# Patient Record
Sex: Female | Born: 1940 | Race: White | Hispanic: No | Marital: Married | Smoking: Never smoker
Health system: Southern US, Academic
[De-identification: ages and names within clinical notes are randomized; demographics above are authoritative.]

## PROBLEM LIST (undated history)

## (undated) DIAGNOSIS — C801 Malignant (primary) neoplasm, unspecified: Secondary | ICD-10-CM

## (undated) DIAGNOSIS — I10 Essential (primary) hypertension: Secondary | ICD-10-CM

## (undated) DIAGNOSIS — C9000 Multiple myeloma not having achieved remission: Secondary | ICD-10-CM

## (undated) HISTORY — PX: HX BACK SURGERY: SHX140

## (undated) NOTE — Progress Notes (Signed)
 Formatting of this note is different from the original.  Subjective   Patient ID: Katelyn Craig is a 40 y.o. female presenting to the Urgent Care with a chief complaint of toe redness (Bilateral feet, all toes very red and sore, toenails are starting to split after getting them done at [facility]. ).    Right 2nd toe is infected, red, swollen, painful. Had toes done at the [facility], a school for cosmetology. Her daughter states she thinks she contracted a nail fungus from it, but is also concerned due to the toe being split open and it looking infected.     History provided by:  Patient and relative  History limited by:  Age  Language interpreter used: No      Objective   Vitals:    07/25/24 1326   BP: 119/69   Pulse: 72   Resp: 20   Temp: 36.4 C (97.6 F)   TempSrc: Temporal   SpO2: 98%   Weight: 66.2 kg (146 lb)   Height: 1.778 m (5' 10)     No results found.   Vital signs reviewed.    Physical Exam  Vitals reviewed.   Constitutional:       Appearance: Normal appearance.   Cardiovascular:      Rate and Rhythm: Normal rate.   Pulmonary:      Effort: Pulmonary effort is normal.   Skin:     General: Skin is warm.      Capillary Refill: Capillary refill takes less than 2 seconds.      Findings: Erythema present.      Comments: Right 2nd toe is split down the middle, traveling about halfway down the toenail. The rest of the toes have thick, yellow, crusty appearance to the toenails. Daughter is concerned about fungus. Discussed that her PCP would take a piece of the toenail and send it in as a specimen and that she would have to do routine liver enzymes due to the medication being hard on the liver. I also discussed with her that the PCP may decide not to do the medication because it is so hard on the liver. She will follow up with PCP for ongoing care.   Neurological:      General: No focal deficit present.      Mental Status: She is alert and oriented to person, place, and time.   Psychiatric:          Mood and Affect: Mood normal.         Behavior: Behavior normal.         Assessment & Plan    Assessment & Plan  Infection of toenail            In-House Lab Results:   No results found for this or any previous visit.     In-House Imaging Reads:        Procedure Documentation:  Procedures     ED Course & MDM   MDM - Medical Decision Making: Independent historian used.   Electronically signed by Dufm Stephane Lunger, CRNP at 07/25/2024  1:49 PM EST

---

## 1997-11-16 ENCOUNTER — Other Ambulatory Visit (HOSPITAL_COMMUNITY): Payer: Self-pay

## 2014-12-14 NOTE — Unmapped External Note (Signed)
 Procedure(s):  LUMBAR STABILIZATION T12-L4 percutaneous fusion with [employer], O- Arm Guidance   Procedure Note    Katelyn Craig   [id number]  12/14/2014      Pre-op Diagnosis: L2 instability        Post-op Diagnosis: SAME    CPT Code: * Diagnosis form incomplete *.    ICD-10 : * No Diagnosis Codes entered *    Surgeon(s) and Role:     * Ozell Glendia Bolls, PA-C - Assisting     * Darryle Earthly, MD - Primary    Laterality : N/A    Anesthesia: General    Staff:   Circulator: Juliana Dirzo Blevins, RN  Scrub Person: Aloysius Shelly Chari Edrick    Estimated Blood Loss: 200 mL    Total IV Fluids :  ml    Urinary Output:  ml                 Specimens: * No specimens in log *    Findings:             Drains:   Urethral Catheter 12/14/14 0725 Non-latex;Temperature probe 16 Fr. (Active)       Condition and Disposition : Stable    Electronically signed by: Ozell Glendia Bolls, PA-C  Date: 12/14/2014  Time: 10:54 AM                        Electronically signed by: Ozell Glendia Bolls, PA-C  12/14/14 1054

## 2017-04-16 IMAGING — US ULTRASOUND BREAST
1 series · 13 of 25 positions shown · non-contrast
Comparison: None. This is a baseline exam.

PETR, POOYA

Exam:  
3D bilateral diagnostic mammogram Tomosynthesis with CAD
Exam:
Complete right breast ultrasound
Complete left breast ultrasound
INDICATION: Palpable lump left breast. Patient could not pinpoint the location of the lump at the time of examination.

[Series 1: ultrasound breast · 0.10mm/px · 13 of 41 slices shown]
[im 1/41]
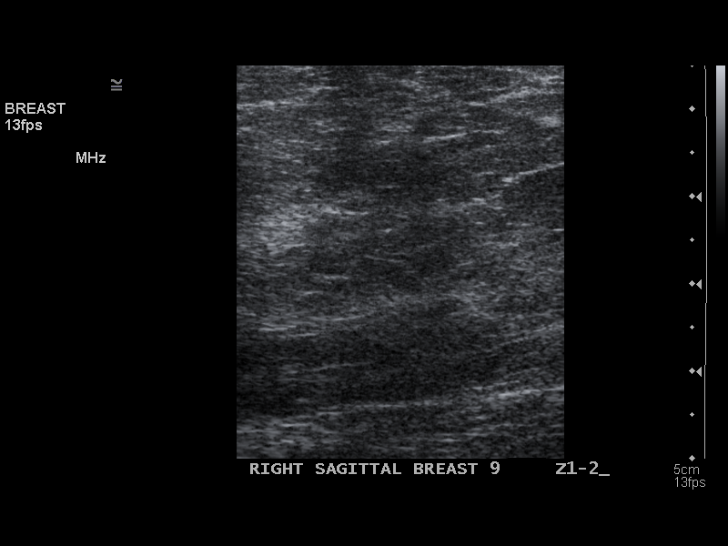
[im 4/41]
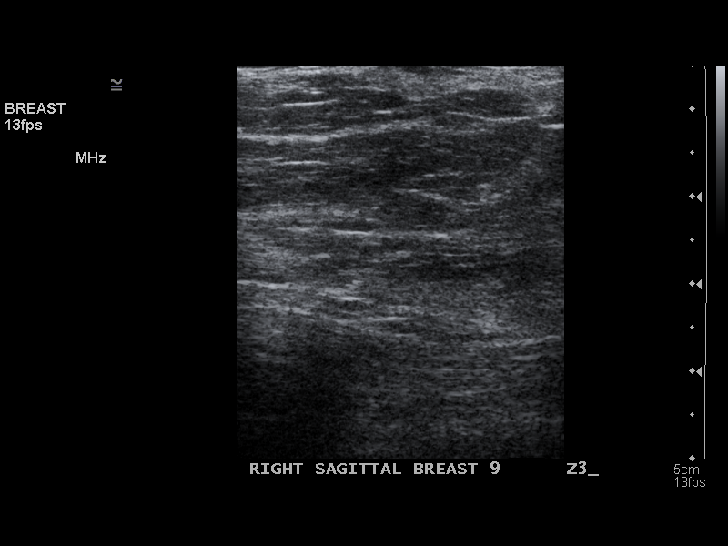
[im 7/41]
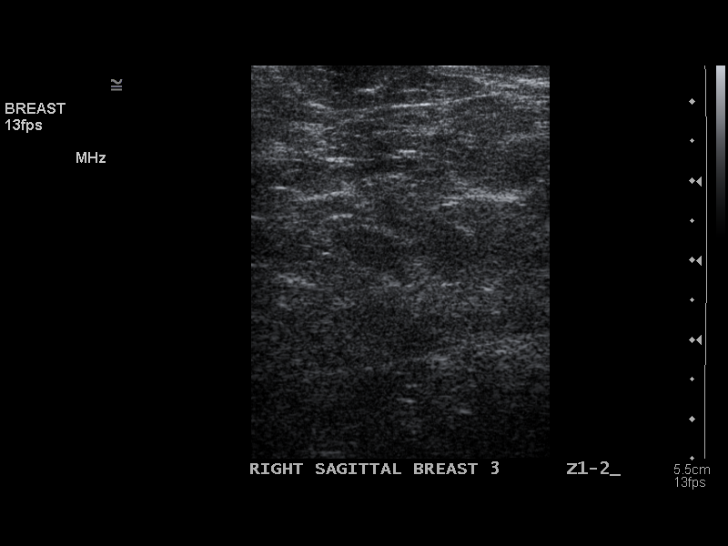
[im 11/41]
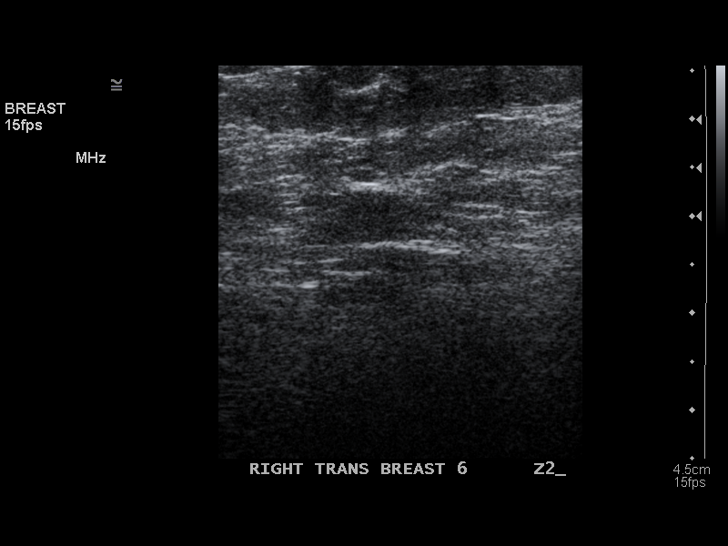
[im 14/41]
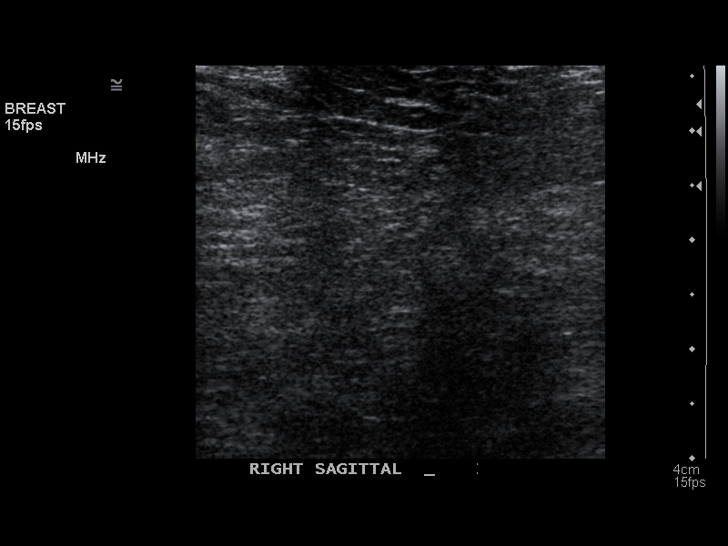
[im 17/41]
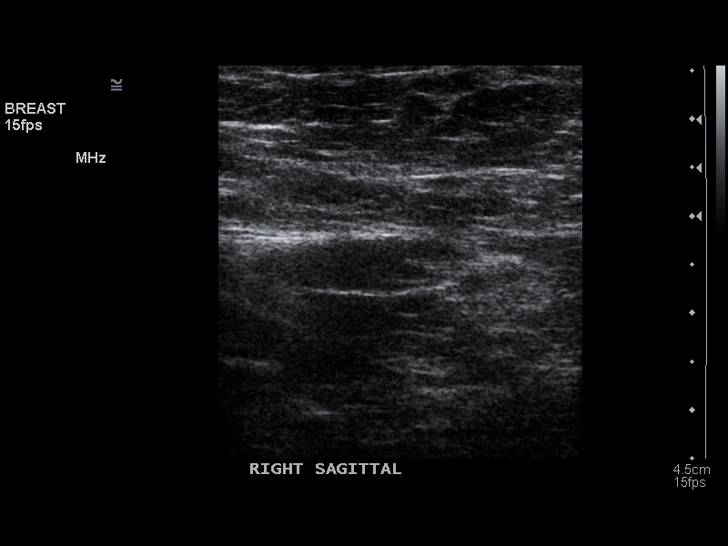
[im 21/41]
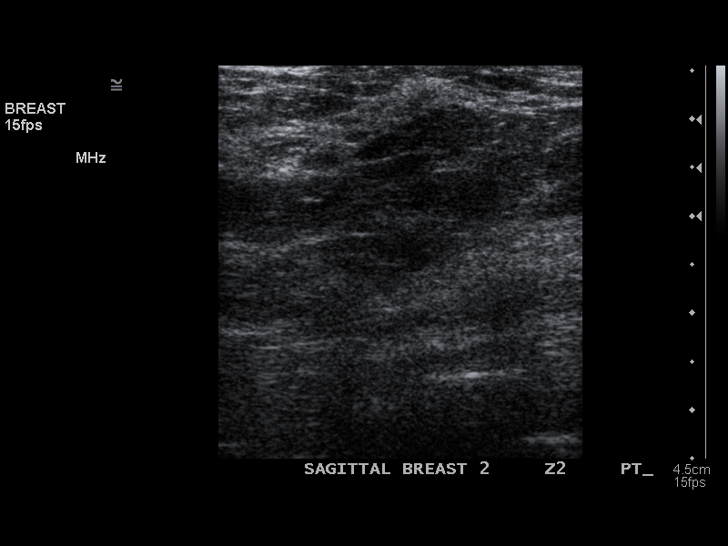
[im 24/41]
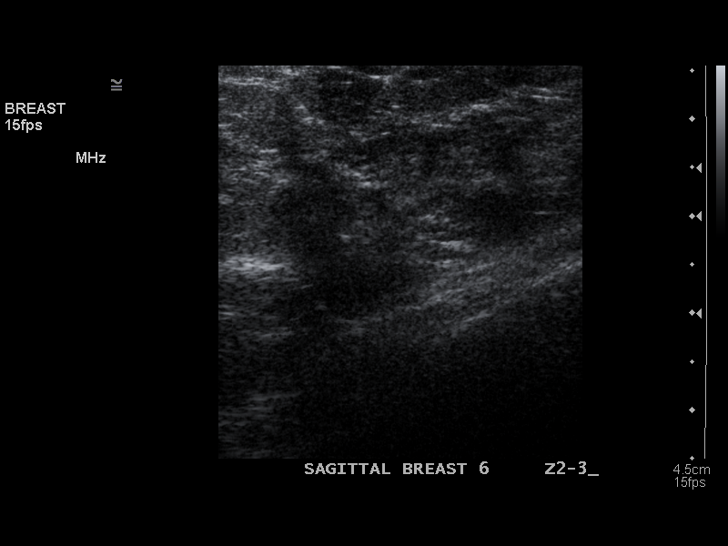
[im 27/41]
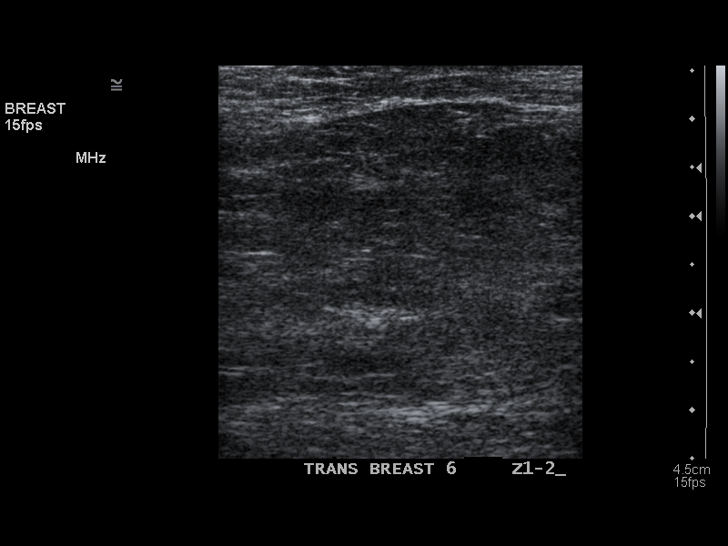
[im 31/41]
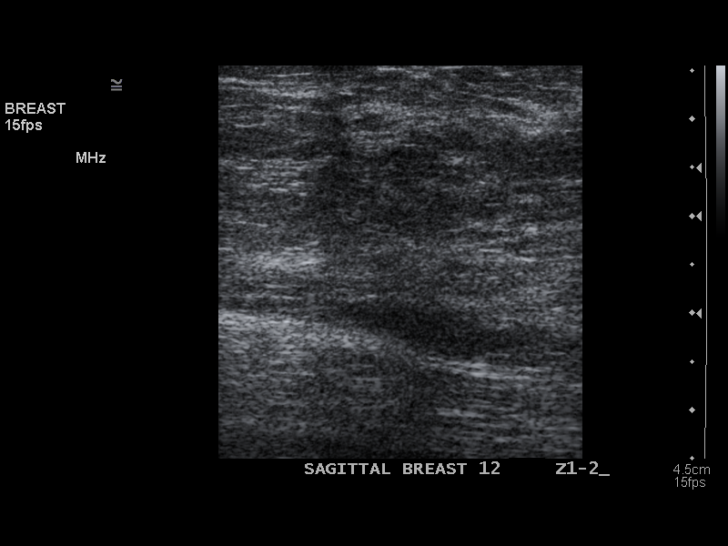
[im 34/41]
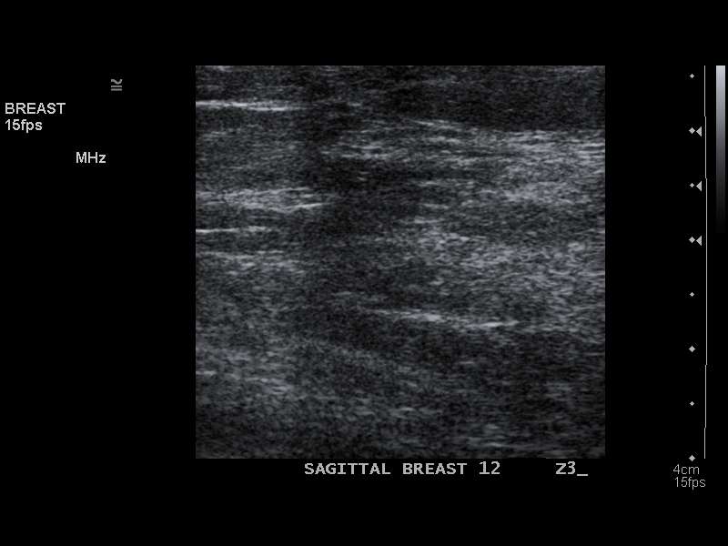
[im 37/41]
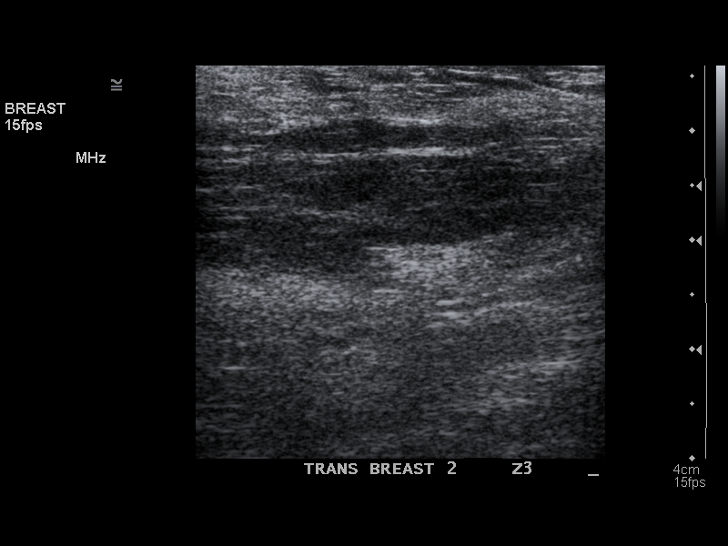
[im 41/41]
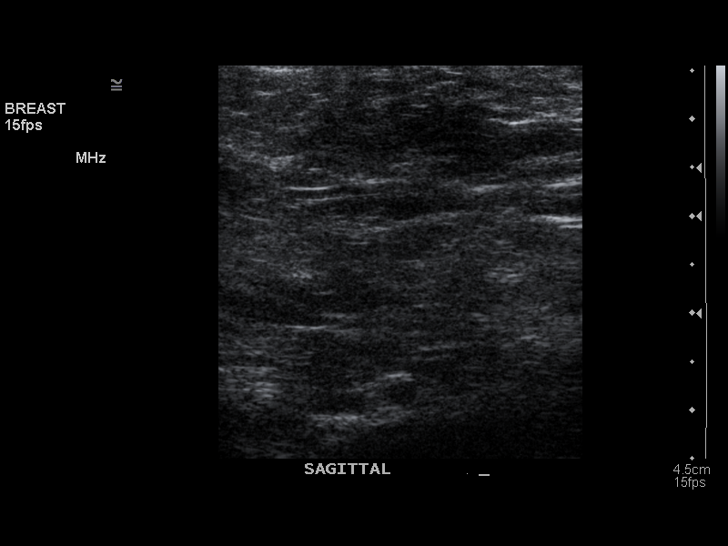

[13 of 25 positions shown; findings below may reference images not displayed]

FINDINGS: There are scattered fibroglandular elements. There are a few subcentimeter benign appearing well circumscribed nodules within the left breast. There is suspicious cluster of microcalcifications. There is no architectural distortion, skin thickening or nipple retraction. 

A comprehensive sonographic evaluation of both breasts was performed and represented images of all four quadrants, retroareolar and axillary regions were obtained. There is no suspicious or cystic mass. No ductal dilatation or axillary adenopathy is identified on either side.
IMPRESSION: BIRADS 3-Probable benign findings. Six month followup left diagnostic mammogram is recommended for reassessment of the subcentimeter nodules given the lack of previous comparison mammograms. 

DENSITY CODE –  B (Scattered areas of fibroglandular density). 

Final Assessment Code:

Bi-Rads 3

BI-RADS 0
Need additional imaging evaluation

BI-RADS 1
Negative mammogram

BI-RADS 2
Benign finding

BI-RADS 3
Probably benign finding: short-interval follow-up suggested

BI-RADS 4
Suspicious abnormality:  biopsy should be considered

BI-RADS 5
Highly suggestive of malignancy; appropriate action should be taken

BI-RADS 6
Known Biopsy-proven Malignancy – Appropriate action should be taken

NOTE:
In compliance with Federal regulations, the results of this mammogram are being sent to the patient.

## 2017-04-16 IMAGING — MG 3D DX MAMMO BIL W/CAD
5 series · 7 of 24 positions shown · non-contrast
Comparison: None. This is a baseline exam.

------------- REPORT [id number] -------------
MANE, TURY

Exam:  
3D bilateral diagnostic mammogram Tomosynthesis with CAD
Exam:
Complete right breast ultrasound
Complete left breast ultrasound
INDICATION: Palpable lump left breast. Patient could not pinpoint the location of the lump at the time of examination.

[R CC · right · 0.10mm/px · 2 of 2 slices shown]
[im 1/2]
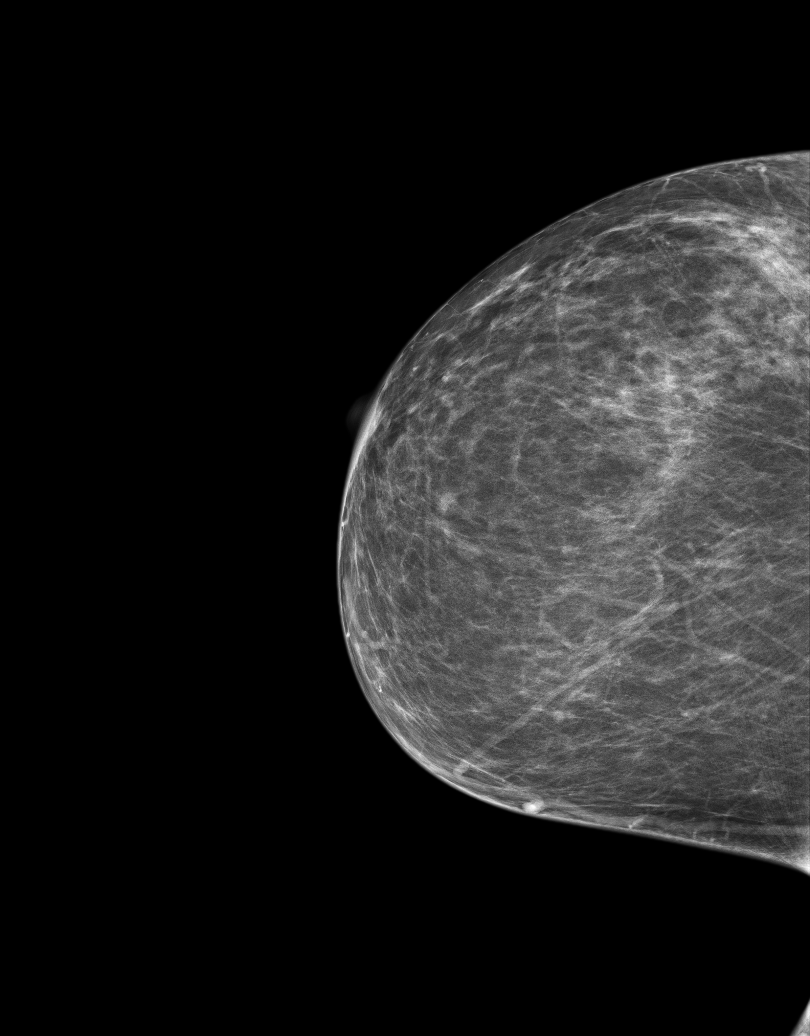
[im 2/2]
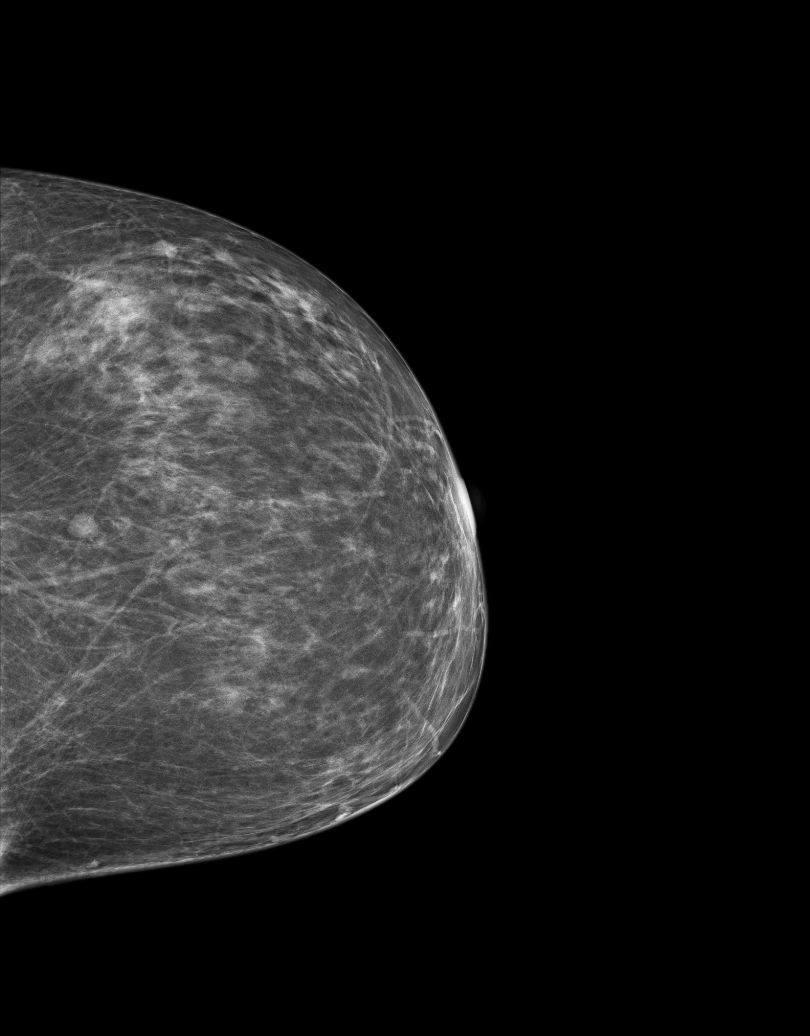

[3D DX MAMMO BIL W/CAD · 2 acquisitions, 2 frames shown (1 of 2)]
[im 1/2]
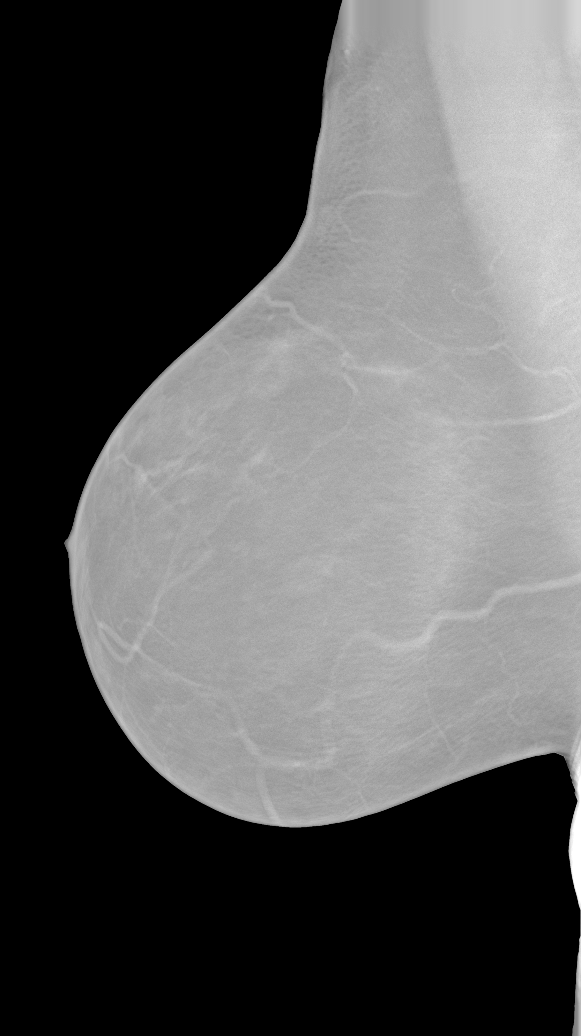
[im 2/2]
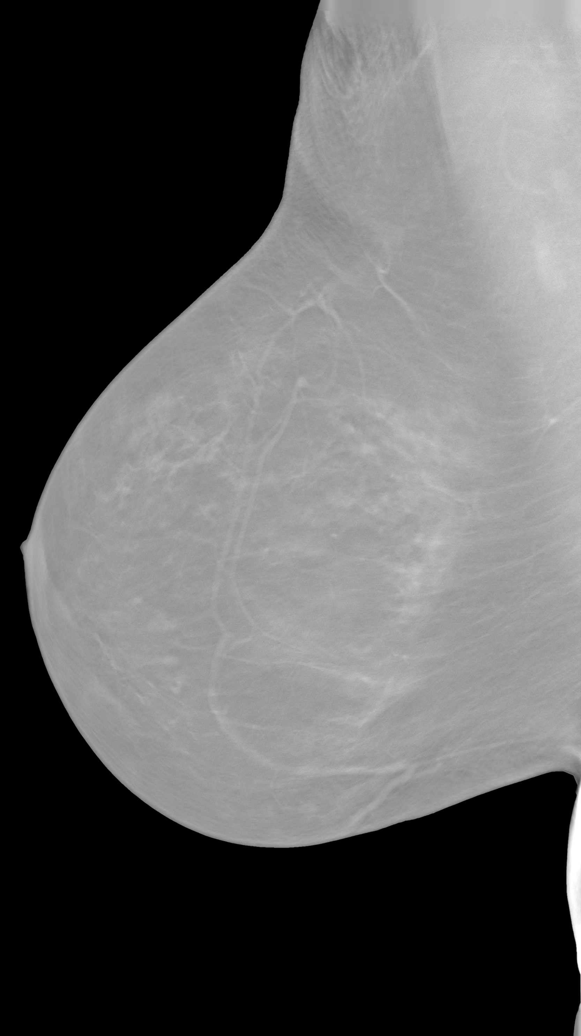

[R]
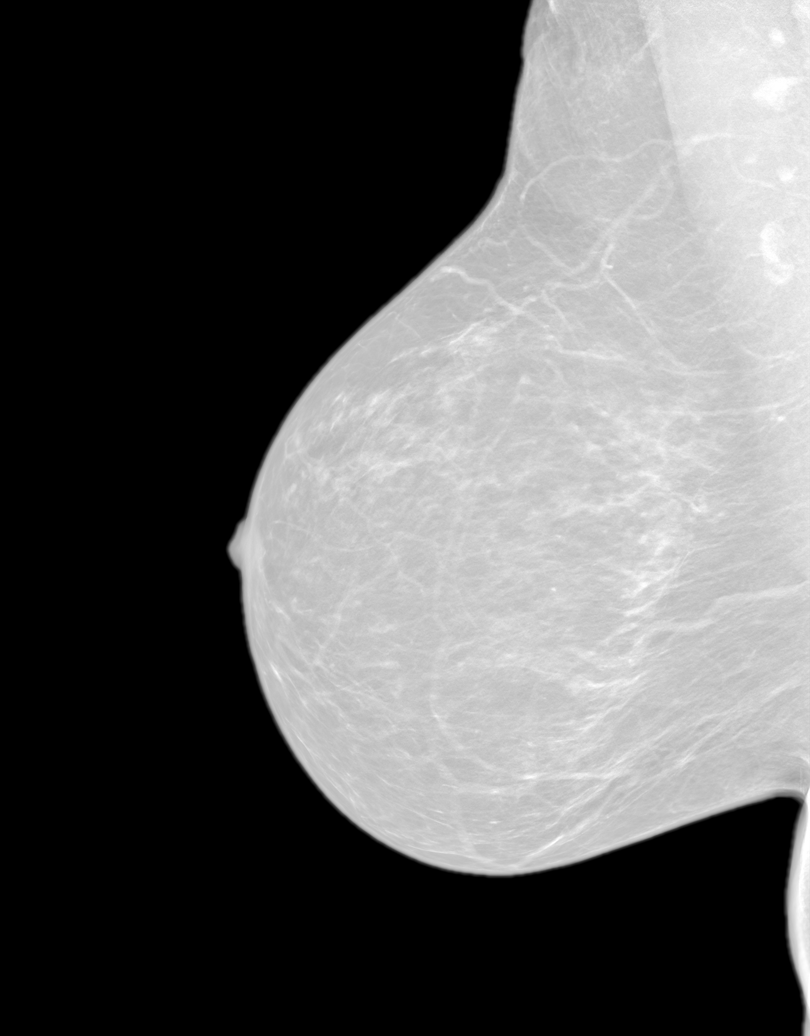

[3D DX MAMMO BIL W/CAD (2 of 2) · tomo slice 12/72.0]
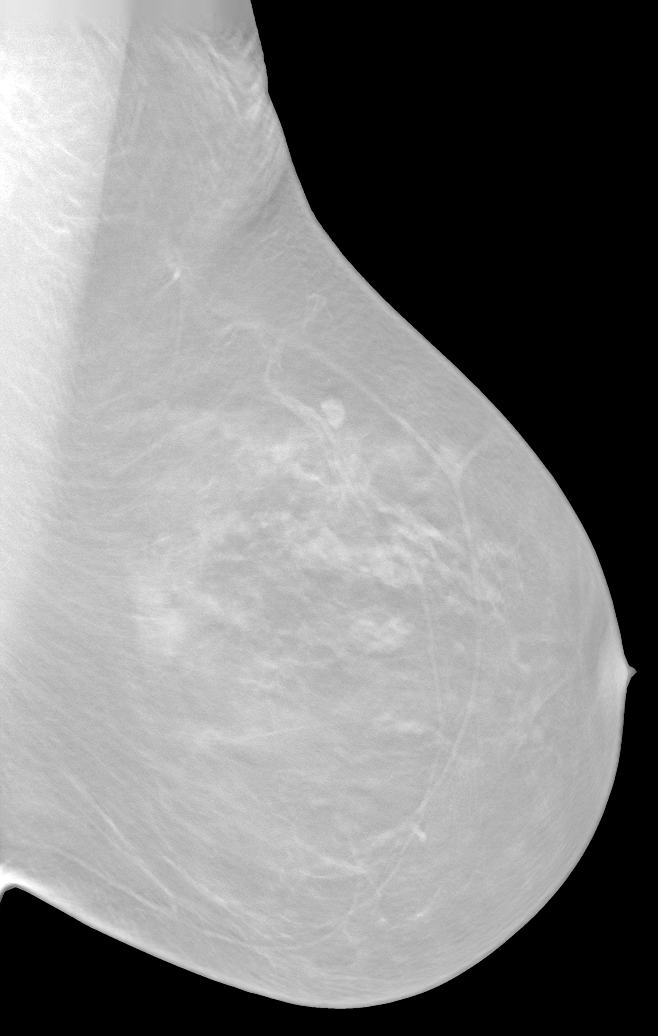

[L]
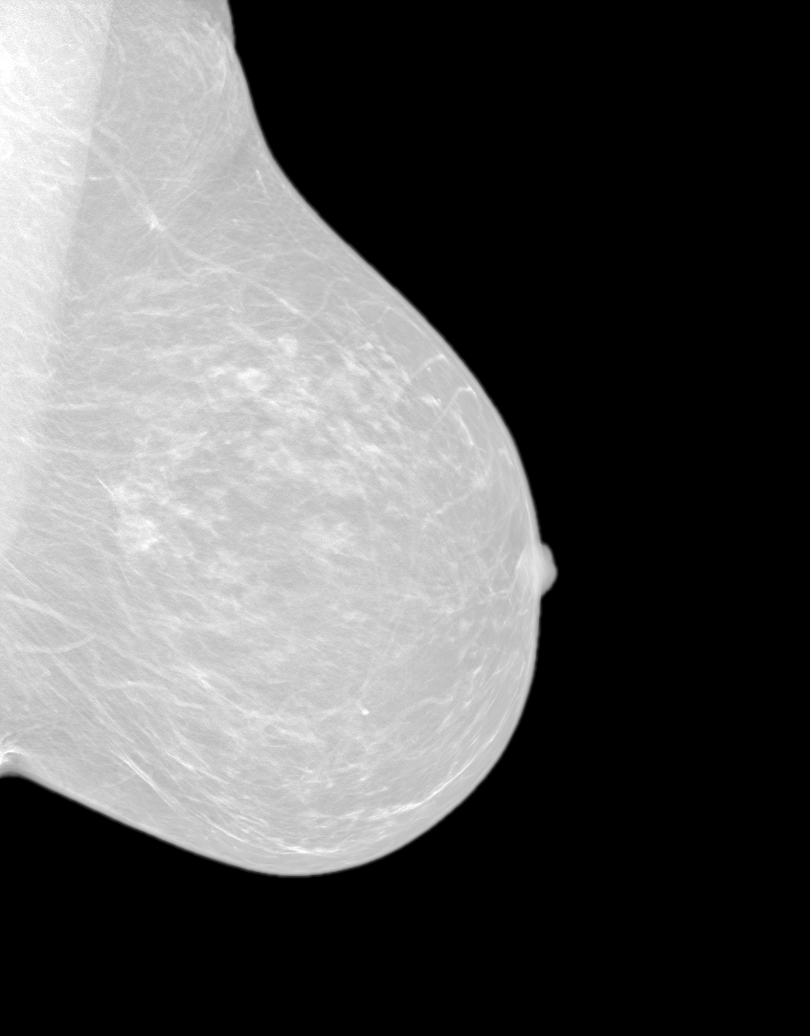

[7 of 24 positions shown; findings below may reference images not displayed]

FINDINGS: There are scattered fibroglandular elements. There are a few subcentimeter benign appearing well circumscribed nodules within the left breast. There is suspicious cluster of microcalcifications. There is no architectural distortion, skin thickening or nipple retraction. 

A comprehensive sonographic evaluation of both breasts was performed and represented images of all four quadrants, retroareolar and axillary regions were obtained. There is no suspicious or cystic mass. No ductal dilatation or axillary adenopathy is identified on either side.
IMPRESSION: BIRADS 3-Probable benign findings. Six month followup left diagnostic mammogram is recommended for reassessment of the subcentimeter nodules given the lack of previous comparison mammograms. 

DENSITY CODE –  B (Scattered areas of fibroglandular density). 

Final Assessment Code:

Bi-Rads 3

BI-RADS 0
Need additional imaging evaluation

BI-RADS 1
Negative mammogram

BI-RADS 2
Benign finding

BI-RADS 3
Probably benign finding: short-interval follow-up suggested

BI-RADS 4
Suspicious abnormality:  biopsy should be considered

BI-RADS 5
Highly suggestive of malignancy; appropriate action should be taken

BI-RADS 6
Known Biopsy-proven Malignancy – Appropriate action should be taken

NOTE:
In compliance with Federal regulations, the results of this mammogram are being sent to the patient.

------------- REPORT [id number] -------------
[facility]
[address]
Isacc Ms. SCHUNAIX, SHAHAAIM:
We wish to report the following on your recent mammography examination. We are sending a report to your referring physician or other health care provider. 
(
Probably benign (not cancer):
Recommend repeat mammogram in 6 months. Please follow-up with your referring physician.
This statement is mandated by the [place], Department of Health.

Your examination was performed by one of our technologists, who are registered radiological technologists and also specially certified in mammography:
___
Mounla, Panteley (M)
___
Gordillo, Volkswagen (M)
___
Boron, Farukas (M)

Your mammogram was interpreted by our radiologist.

( 
Majidreza Gutke, M.D.

(Annual Breast Examination by a physician or other health care provider
(Annual Mammography Screening beginning at age 40
(Monthly Breast Self Examination

## 2017-11-27 IMAGING — MG 3D DX MAMMO UNI W/CAD
2 series · 3 of 11 positions shown · non-contrast
Comparison: Prior study dated March 28, 2018.

------------- REPORT [id number] -------------
INFINITY, SCHA

EXAM:  3D UNILATERAL LEFT DIAGNOSTIC DIGITAL MAMMOGRAM WITH TOMOSYNTHESIS AND CAD
INDICATION: Follow-up.

[L]
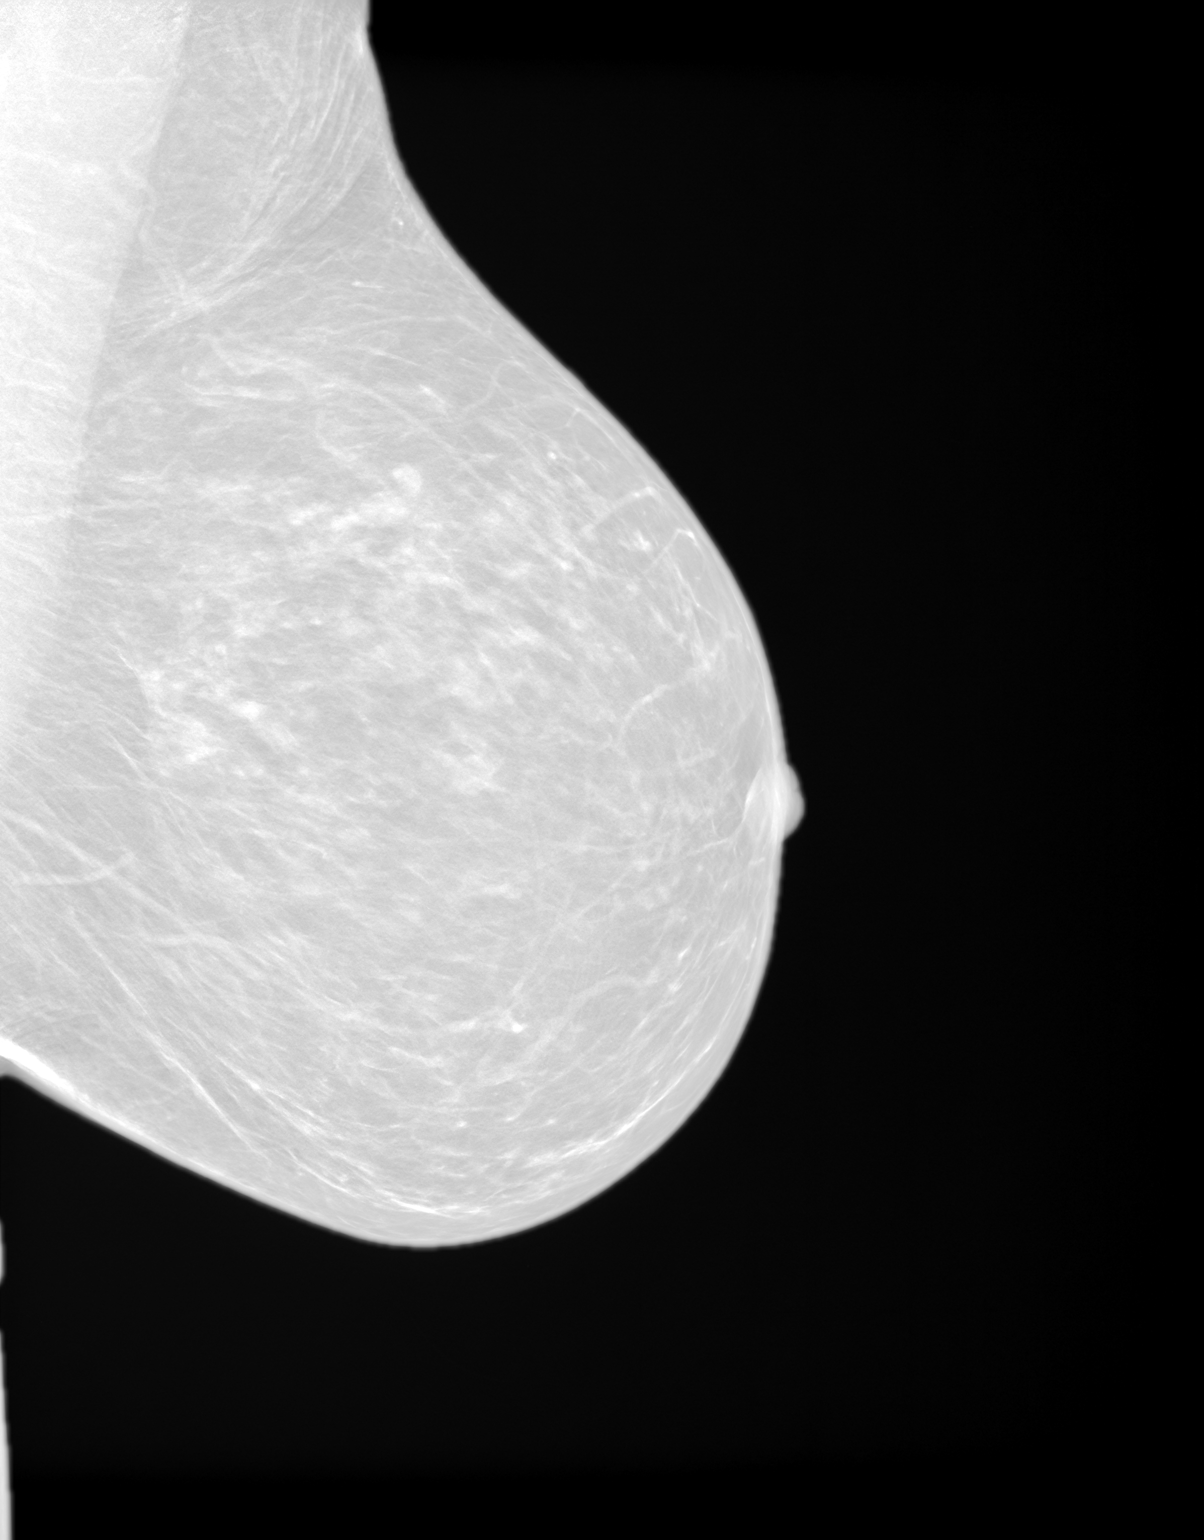

[Series 2222: 3D DX MAMMO UNI W/CAD · 2 acquisitions, 2 frames shown]
[im 1/2]
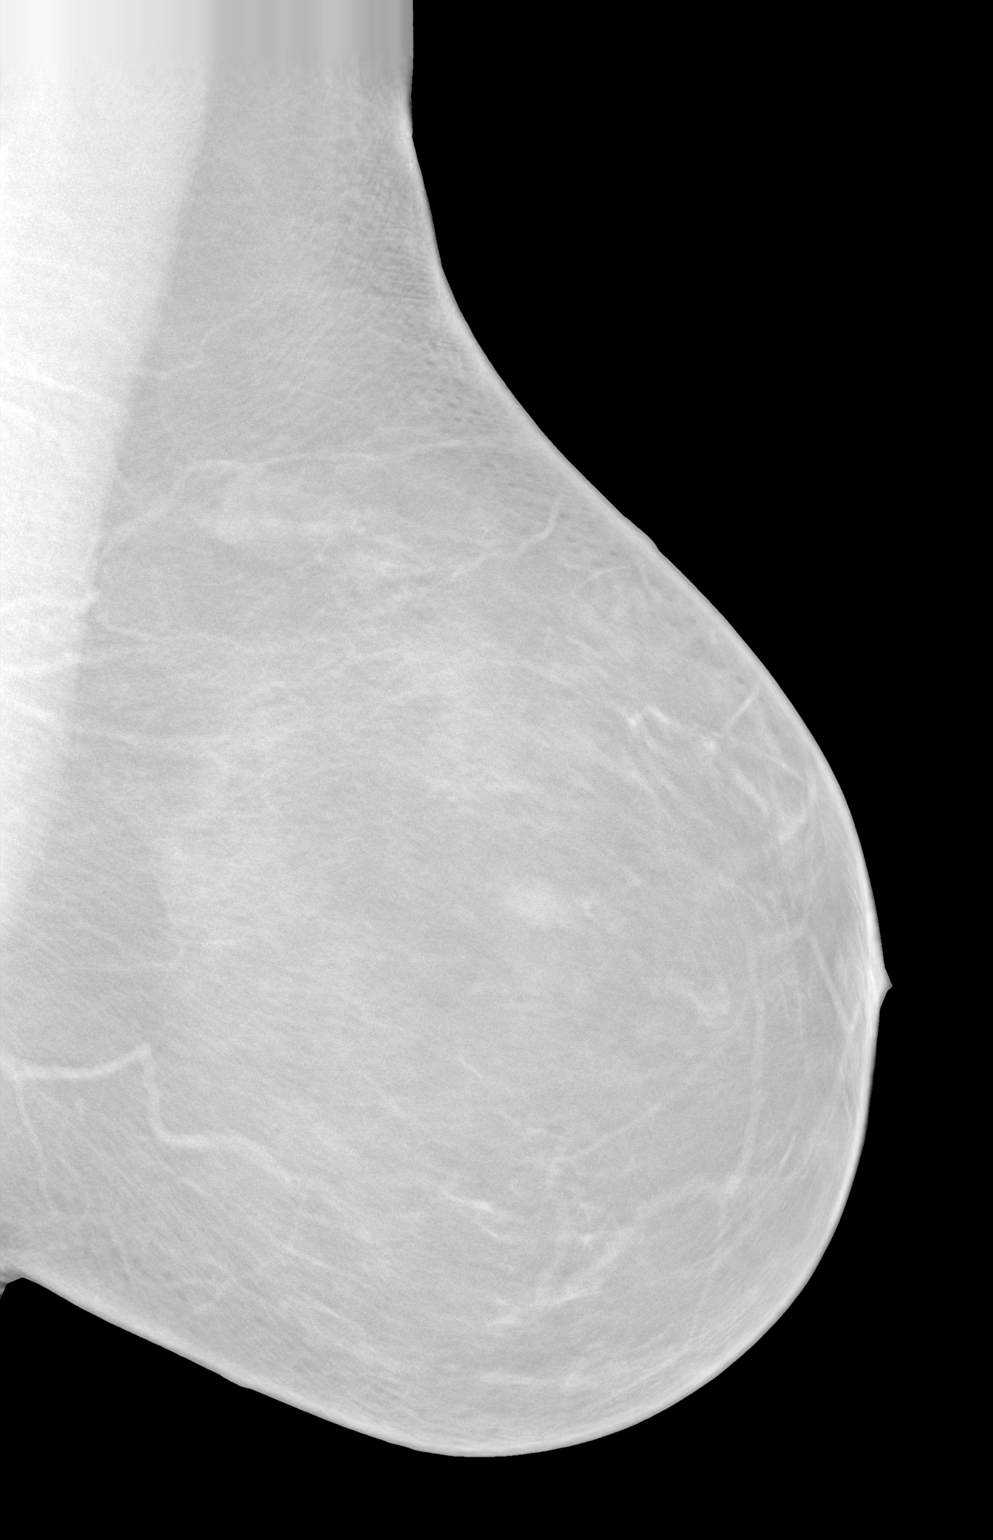
[im 2/2]
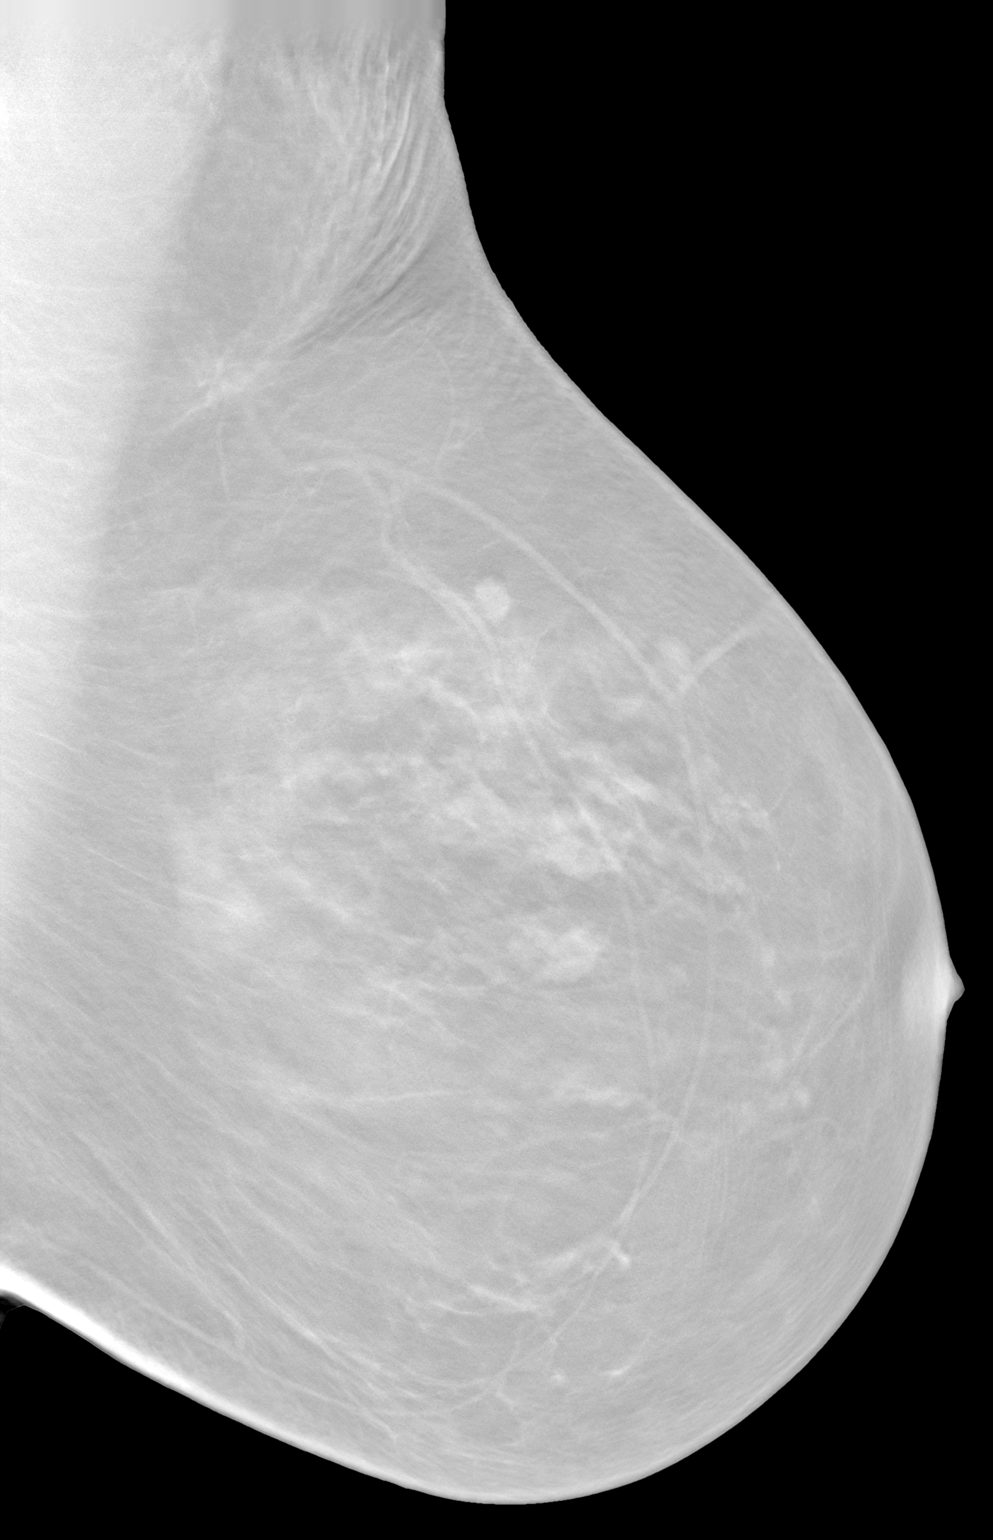

[3 of 11 positions shown; findings below may reference images not displayed]

FINDINGS: A unilateral left mammogram is performed.  The nodular density noted within the left breast on the previous study is again seen and unchanged.  No development calcifications or change in outline is noted.  No new nodular densities are present.
IMPRESSION: 1.  Normal nodular density seen in the left breast when compared with the previous study. 

2.  No new nodular densities have occurred. 

3.  ACR BREAST DENSITY:  C (Heterogeneously dense). 

4.  BIRADS 2-Benign findings. Patient has been added in a reminder system with a target date for the next screening mammography.

5.  Management recommendation:  Routine mammographic screening.   

Final Assessment Code:

Bi-Rads 2 

BI-RADS 0
Need additional imaging evaluation

BI-RADS 1
Negative mammogram

BI-RADS 2
Benign finding

BI-RADS 3
Probably benign finding: short-interval follow-up suggested

BI-RADS 4
Suspicious abnormality:  biopsy should be considered

BI-RADS 5
Highly suggestive of malignancy; appropriate action should be taken

BI-RADS 6
Known Biopsy-proven Malignancy – Appropriate action should be taken

NOTE:
In compliance with Federal regulations, the results of this mammogram are being sent to the patient.

------------- REPORT [id number] -------------
[facility]
[address]
Daina Ms.HURMA, KLINTON TONI:
We wish to report the following on your recent mammography examination. We are sending a report to your referring physician or other health care provider. 
(       Normal/Negative:
No evidence of cancer.
This statement is mandated by the [place], Department of Health.
Your examination was performed by one of our technologists, who are registered radiological technologists and also specially certified in mammography:
___
Parlak, Edaly (M)
___
Dang, Mcalex (M)

Your mammogram was interpreted by our radiologist.

( 
Sofeine Made, M.D.

(Annual Breast Examination by a physician or other health care provider
(Annual Mammography Screening beginning at age 40
(Monthly Breast Self Examination

## 2022-02-24 ENCOUNTER — Emergency Department (EMERGENCY_DEPARTMENT_HOSPITAL)
Admission: EM | Admit: 2022-02-24 | Discharge: 2022-02-25 | Disposition: A | Discharge status: Home / Self Care | Payer: Medicare Other | Source: Home / Self Care | Attending: Emergency Medicine | Admitting: Emergency Medicine

## 2022-02-24 ENCOUNTER — Inpatient Hospital Stay (HOSPITAL_COMMUNITY): Payer: Medicare Other

## 2022-02-24 ENCOUNTER — Other Ambulatory Visit: Payer: Self-pay

## 2022-02-24 DIAGNOSIS — Z20822 Contact with and (suspected) exposure to covid-19: Secondary | ICD-10-CM | POA: Insufficient documentation

## 2022-02-24 DIAGNOSIS — C9000 Multiple myeloma not having achieved remission: Secondary | ICD-10-CM | POA: Insufficient documentation

## 2022-02-24 DIAGNOSIS — R197 Diarrhea, unspecified: Secondary | ICD-10-CM | POA: Insufficient documentation

## 2022-02-24 DIAGNOSIS — R509 Fever, unspecified: Secondary | ICD-10-CM | POA: Insufficient documentation

## 2022-02-24 DIAGNOSIS — J069 Acute upper respiratory infection, unspecified: Secondary | ICD-10-CM | POA: Insufficient documentation

## 2022-02-24 DIAGNOSIS — E860 Dehydration: Secondary | ICD-10-CM | POA: Insufficient documentation

## 2022-02-24 DIAGNOSIS — R0989 Other specified symptoms and signs involving the circulatory and respiratory systems: Secondary | ICD-10-CM

## 2022-02-24 HISTORY — DX: Essential (primary) hypertension: I10

## 2022-02-24 HISTORY — DX: Multiple myeloma not having achieved remission (CMS HCC): C90.00

## 2022-02-24 LAB — CBC WITH DIFF
BASOPHIL #: 0 10*3/uL (ref 0.00–0.30)
BASOPHIL %: 1 % (ref 0–3)
EOSINOPHIL #: 0 10*3/uL (ref 0.00–0.80)
EOSINOPHIL %: 1 % (ref 0–7)
HCT: 34.5 % — ABNORMAL LOW (ref 37.0–47.0)
HGB: 12.2 g/dL — ABNORMAL LOW (ref 12.5–16.0)
LYMPHOCYTE #: 0.4 10*3/uL — ABNORMAL LOW (ref 1.10–5.00)
LYMPHOCYTE %: 12 % — ABNORMAL LOW (ref 25–45)
MCH: 31.4 pg (ref 27.0–32.0)
MCHC: 35.3 g/dL (ref 32.0–36.0)
MCV: 89 fL (ref 78.0–99.0)
MONOCYTE #: 0.7 10*3/uL (ref 0.00–1.30)
MONOCYTE %: 20 % — ABNORMAL HIGH (ref 0–12)
MPV: 9.5 fL (ref 7.4–10.4)
NEUTROPHIL #: 2.4 10*3/uL (ref 1.80–8.40)
NEUTROPHIL %: 66 % (ref 40–76)
PLATELETS: 130 10*3/uL — ABNORMAL LOW (ref 140–440)
RBC: 3.88 10*6/uL — ABNORMAL LOW (ref 4.20–5.40)
RDW: 15.9 % — ABNORMAL HIGH (ref 11.6–14.8)
WBC: 3.6 10*3/uL — ABNORMAL LOW (ref 4.0–10.5)
WBCS UNCORRECTED: 3.6 10*3/uL

## 2022-02-24 LAB — COMPREHENSIVE METABOLIC PANEL, NON-FASTING
ALBUMIN/GLOBULIN RATIO: 1.2 (ref 0.8–1.4)
ALBUMIN: 4 g/dL (ref 3.5–5.7)
ALKALINE PHOSPHATASE: 75 U/L (ref 34–104)
ALT (SGPT): 11 U/L (ref 7–52)
ANION GAP: 9 mmol/L — ABNORMAL LOW (ref 10–20)
AST (SGOT): 19 U/L (ref 13–39)
BILIRUBIN TOTAL: 1.4 mg/dL — ABNORMAL HIGH (ref 0.3–1.2)
BUN/CREA RATIO: 22 (ref 6–22)
BUN: 18 mg/dL (ref 7–25)
CALCIUM, CORRECTED: 8.7 mg/dL — ABNORMAL LOW (ref 8.9–10.8)
CALCIUM: 8.7 mg/dL (ref 8.6–10.3)
CHLORIDE: 100 mmol/L (ref 98–107)
CO2 TOTAL: 26 mmol/L (ref 21–31)
CREATININE: 0.83 mg/dL (ref 0.60–1.30)
ESTIMATED GFR: 72 mL/min/{1.73_m2} (ref >59)
GLOBULIN: 3.3 (ref 2.9–5.4)
GLUCOSE: 121 mg/dL — ABNORMAL HIGH (ref 74–109)
OSMOLALITY, CALCULATED: 273 mOsm/kg (ref 270–290)
POTASSIUM: 3.1 mmol/L — ABNORMAL LOW (ref 3.5–5.1)
PROTEIN TOTAL: 7.3 g/dL (ref 6.4–8.9)
SODIUM: 135 mmol/L — ABNORMAL LOW (ref 136–145)

## 2022-02-24 LAB — COVID-19, FLU A/B, RSV RAPID BY PCR
INFLUENZA VIRUS TYPE A: NOT DETECTED
INFLUENZA VIRUS TYPE B: NOT DETECTED
RESPIRATORY SYNCTIAL VIRUS (RSV): NOT DETECTED
SARS-CoV-2: NOT DETECTED

## 2022-02-24 LAB — URINALYSIS, MICROSCOPIC
BACTERIA: NEGATIVE /hpf
SQUAMOUS EPITHELIAL: 3 /hpf (ref <28)

## 2022-02-24 LAB — C-REACTIVE PROTEIN(CRP),INFLAMMATION: C-REACTIVE PROTEIN (CRP): 12.3 mg/dL — ABNORMAL HIGH (ref 0.1–0.5)

## 2022-02-24 LAB — URINALYSIS, MACROSCOPIC
BILIRUBIN: NEGATIVE mg/dL
BLOOD: 0.03 mg/dL
GLUCOSE: NEGATIVE mg/dL
KETONES: 20 mg/dL — AB
LEUKOCYTES: 25 WBCs/uL — AB
NITRITE: NEGATIVE
PH: 5.5 (ref 5.0–9.0)
PROTEIN: 100 mg/dL — AB
SPECIFIC GRAVITY: 1.024 (ref 1.002–1.030)
UROBILINOGEN: NORMAL mg/dL

## 2022-02-24 LAB — RAPID THROAT SCREEN, STREPTOCOCCUS, WITH REFLEX: THROAT RAPID SCREEN, STREPTOCOCCUS: NEGATIVE

## 2022-02-24 NOTE — ED APP Handoff Note (Signed)
[facility]  Emergency Department  Provider in Triage Note    Name: RONNICA DREESE  Age: 81 y.o.  Gender: female     Subjective:   Jacalynn A Poythress is a 81 y.o. female who presents with complaint of Fever  .  Pts family reports Tmax of 105 at home, she took 1000 mg of tylenol around 1800. She also reports cough and congestion.     Objective:   Filed Vitals:    02/24/22 1857   BP: 138/87   Pulse: (!) 115   Resp: (!) 21   Temp: 37.8 C (100 F)   SpO2: 96%      Focused Physical Exam shows WNWD female pt, arrives in wheelchair.    Assessment:  A medical screening exam was completed.  This patient is a 81 y.o. female with initial findings showing fever    Plan:  Please see initial orders and work-up below.  This is to be continued with full evaluation in the main Emergency Department.     No current facility-administered medications for this encounter.     Results for orders placed or performed during the hospital encounter of 02/24/22 (from the past 24 hour(s))   CBC/DIFF    Narrative    The following orders were created for panel order CBC/DIFF.  Procedure                               Abnormality         Status                     ---------                               -----------         ------                     CBC WITH OVZC[[id number]]                                                                 Please view results for these tests on the individual orders.   URINALYSIS, MACROSCOPIC AND MICROSCOPIC W/CULTURE REFLEX    Specimen: Urine, Clean Catch    Narrative    The following orders were created for panel order URINALYSIS, MACROSCOPIC AND MICROSCOPIC W/CULTURE REFLEX.  Procedure                               Abnormality         Status                     ---------                               -----------         ------                     URINALYSIS, MACROSCOPIC[[id number]]  URINALYSIS, MICROSCOPIC[[id number]]                                                        Please view results for these tests on the individual orders.        [place], PA-C  02/24/2022, 18:58

## 2022-02-24 NOTE — ED Triage Notes (Signed)
Fever of up to 105, cough, congestion, fatigue. '1000mg'$  of Tylenol approximately 1 hour ago.

## 2022-02-25 ENCOUNTER — Encounter (HOSPITAL_COMMUNITY): Payer: Self-pay

## 2022-02-25 MED ORDER — IBUPROFEN 400 MG TABLET
600.0000 mg | ORAL_TABLET | ORAL | Status: AC
Start: 2022-02-25 — End: 2022-02-25
  Administered 2022-02-25: 600 mg via ORAL

## 2022-02-25 MED ORDER — SODIUM CHLORIDE 0.9 % INTRAVENOUS PIGGYBACK
INJECTION | INTRAVENOUS | Status: AC
Start: 2022-02-25 — End: 2022-02-25
  Filled 2022-02-25: qty 100

## 2022-02-25 MED ORDER — PIPERACILLIN-TAZOBACTAM 4.5 GRAM INTRAVENOUS SOLUTION
INTRAVENOUS | Status: AC
Start: 2022-02-25 — End: 2022-02-25
  Filled 2022-02-25: qty 20

## 2022-02-25 MED ORDER — SODIUM CHLORIDE 0.9 % IV BOLUS
1000.0000 mL | INJECTION | Status: AC
Start: 2022-02-25 — End: 2022-02-25
  Administered 2022-02-25: 0 mL via INTRAVENOUS
  Administered 2022-02-25: 1000 mL via INTRAVENOUS

## 2022-02-25 MED ORDER — SODIUM CHLORIDE 0.9 % INTRAVENOUS PIGGYBACK
4.5000 g | INTRAVENOUS | Status: AC
Start: 2022-02-25 — End: 2022-02-25
  Administered 2022-02-25: 0 g via INTRAVENOUS
  Administered 2022-02-25: 4.5 g via INTRAVENOUS

## 2022-02-25 MED ORDER — DEXAMETHASONE SODIUM PHOSPHATE (PF) 10 MG/ML INJECTION SOLUTION
INTRAMUSCULAR | Status: AC
Start: 2022-02-25 — End: 2022-02-25
  Filled 2022-02-25: qty 1

## 2022-02-25 MED ORDER — LEVOFLOXACIN 750 MG TABLET
750.0000 mg | ORAL_TABLET | Freq: Every day | ORAL | 0 refills | Status: DC
Start: 2022-02-25 — End: 2022-03-03

## 2022-02-25 MED ORDER — IBUPROFEN 400 MG TABLET
ORAL_TABLET | ORAL | Status: AC
Start: 2022-02-25 — End: 2022-02-25
  Filled 2022-02-25: qty 2

## 2022-02-25 MED ORDER — DEXAMETHASONE SODIUM PHOSPHATE (PF) 10 MG/ML INJECTION SOLUTION
10.0000 mg | INTRAMUSCULAR | Status: AC
Start: 2022-02-25 — End: 2022-02-25
  Administered 2022-02-25: 10 mg via INTRAVENOUS

## 2022-02-25 NOTE — ED Nurses Note (Signed)
Family reports some confusion started today with high temp. Patient currently oriented x 4.

## 2022-02-25 NOTE — ED Provider Notes (Signed)
[facility]  Emergency Department  Attending Provider Note      CHIEF COMPLAINT  Chief Complaint   Patient presents with   . Fever     HISTORY OF PRESENT ILLNESS  Katelyn Craig, date of birth [birth year], is a 81 y.o. female who presented to the Emergency Department with family due to a fever.  The patient's son is at the bedside.  He states he checked in his mom today and found her with a fever and confusion.  He states his temperature at that time was nearly 105.  The patient states her husband had upper respiratory symptoms over the past few days.  She complains of a runny nose and cough.  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  She is longer taking any chemotherapy.  While in the ED the patient has had multiple episodes of liquid diarrhea.    PAST MEDICAL/SURGICAL/FAMILY/SOCIAL HISTORY  Past Medical History:   Diagnosis Date   . HTN (hypertension)    . Multiple myeloma ([facility])        Past Surgical History:   Procedure Laterality Date   . HX BACK SURGERY         Family Medical History:    None       Social History     Socioeconomic History   . Marital status: Married   Tobacco Use   . Smoking status: Never   . Smokeless tobacco: Never   Substance and Sexual Activity   . Alcohol use: Never   . Drug use: Never      ALLERGIES  Allergies   Allergen Reactions   . Oxycodone Swelling     Throat swelling; takes hydrocodone at home       [facility]:  Filed Vitals:    02/25/22 0115 02/25/22 0120 02/25/22 0145 02/25/22 0215   BP: (!) 160/86  (!) 117/91 (!) 150/73   Pulse: (!) 119  (!) 112 99   Resp:       Temp:  (!) 40.3 C (104.6 F)     SpO2: 91%  93% 91%     GENERAL: PATIENT IS ALERT AND ORIENTED TO PERSON, PLACE, AND TIME.  HEAD: NORMOCEPHALIC AND ATRAUMATIC.  EYES: PUPILS EQUALLY ROUND AND REACT TO LIGHT. EXTRAOCULAR MOVEMENTS INTACT.  EARS: GROSS HEARING INTACT. EXTERNAL EARS  WITHIN NORMAL LIMITS.  NOSE:  Clear rhinorrhea  THROAT: MOIST ORAL MUCOSA. NO ERYTHEMA OR EXUDATE OF THE PHARYNX.  NECK: SUPPLE. TRACHEA MIDLINE.  CARDIOVASCULAR: REGULAR RATE, AND RHYTHM. NO MURMUR.  LUNGS: CLEAR TO AUSCULTATION BILATERAL.  ABDOMEN: SOFT, NON-TENDER, NON-DISTENDED, AND BOWEL SOUNDS ARE PRESENT.  GENITOURINARY: DEFERRED.  RECTAL: DEFERRED.  EXTREMITIES: NO CYANOSIS, CLUBBING, OR EDEMA.  SKIN: WARM AND DRY.  NEUROLOGIC: CRANIAL NERVES II THROUGH XII ARE GROSSLY INTACT. MOVES ALL 4 EXTREMITIES.  PSYCHIATRIC: JUDGMENT AND INSIGHT ARE SEEMINGLY INTACT. MOOD AND AFFECT ARE APPROPRIATE FOR THE SITUATION.    DIAGNOSTICS  Labs:  Labs listed below were reviewed and interpreted by me.  Results for orders placed or performed during the hospital encounter of 02/24/22   RAPID THROAT SCREEN, STREPTOCOCCUS, WITH REFLEX    Specimen: Throat; Swab   Result Value Ref Range    THROAT RAPID SCREEN, STREPTOCOCCUS Negative Negative   COVID-19, FLU A/B, RSV RAPID BY PCR   Result Value Ref Range    SARS-CoV-2 Not Detected Not Detected  INFLUENZA VIRUS TYPE A Not Detected Not Detected    INFLUENZA VIRUS TYPE B Not Detected Not Detected    RESPIRATORY SYNCTIAL VIRUS (RSV) Not Detected Not Detected   C-REACTIVE PROTEIN(CRP),INFLAMMATION   Result Value Ref Range    C-REACTIVE PROTEIN (CRP) 12.3 (H) 0.1 - 0.5 mg/dL   COMPREHENSIVE METABOLIC PANEL, NON-FASTING   Result Value Ref Range    SODIUM 135 (L) 136 - 145 mmol/L    POTASSIUM 3.1 (L) 3.5 - 5.1 mmol/L    CHLORIDE 100 98 - 107 mmol/L    CO2 TOTAL 26 21 - 31 mmol/L    ANION GAP 9 (L) 10 - 20 mmol/L    BUN 18 7 - 25 mg/dL    CREATININE 0.83 0.60 - 1.30 mg/dL    BUN/CREA RATIO 22 6 - 22    ESTIMATED GFR 72 >59 mL/min/1.65m2    ALBUMIN 4.0 3.5 - 5.7 g/dL    CALCIUM 8.7 8.6 - 10.3 mg/dL    GLUCOSE 121 (H) 74 - 109 mg/dL    ALKALINE PHOSPHATASE 75 34 - 104 U/L    ALT (SGPT) 11 7 - 52 U/L    AST (SGOT) 19 13 - 39 U/L    BILIRUBIN TOTAL 1.4 (H) 0.3 - 1.2 mg/dL    PROTEIN TOTAL 7.3  6.4 - 8.9 g/dL    ALBUMIN/GLOBULIN RATIO 1.2 0.8 - 1.4    OSMOLALITY, CALCULATED 273 270 - 290 mOsm/kg    CALCIUM, CORRECTED 8.7 (L) 8.9 - 10.8 mg/dL    GLOBULIN 3.3 2.9 - 5.4   CBC WITH DIFF   Result Value Ref Range    WBCS UNCORRECTED 3.6 x10^3/uL    WBC 3.6 (L) 4.0 - 10.5 x10^3/uL    RBC 3.88 (L) 4.20 - 5.40 x10^6/uL    HGB 12.2 (L) 12.5 - 16.0 g/dL    HCT 34.5 (L) 37.0 - 47.0 %    MCV 89.0 78.0 - 99.0 fL    MCH 31.4 27.0 - 32.0 pg    MCHC 35.3 32.0 - 36.0 g/dL    RDW 15.9 (H) 11.6 - 14.8 %    PLATELETS 130 (L) 140 - 440 x10^3/uL    MPV 9.5 7.4 - 10.4 fL    NEUTROPHIL % 66 40 - 76 %    LYMPHOCYTE % 12 (L) 25 - 45 %    MONOCYTE % 20 (H) 0 - 12 %    EOSINOPHIL % 1 0 - 7 %    BASOPHIL % 1 0 - 3 %    NEUTROPHIL # 2.40 1.80 - 8.40 x10^3/uL    LYMPHOCYTE # 0.40 (L) 1.10 - 5.00 x10^3/uL    MONOCYTE # 0.70 0.00 - 1.30 x10^3/uL    EOSINOPHIL # 0.00 0.00 - 0.80 x10^3/uL    BASOPHIL # 0.00 0.00 - 0.30 x10^3/uL   URINALYSIS, MACROSCOPIC   Result Value Ref Range    COLOR Yellow Colorless, Light Yellow, Yellow    APPEARANCE Turbid (A) Clear    SPECIFIC GRAVITY 1.024 1.002 - 1.030    PH 5.5 5.0 - 9.0    LEUKOCYTES 25 (A) Negative, 100  WBCs/uL    NITRITE Negative Negative    PROTEIN 100 (A) Negative, 10 , 20  mg/dL    GLUCOSE Negative Negative, 30  mg/dL    KETONES 20 (A) Negative, Trace mg/dL    BILIRUBIN Negative Negative, 0.5 mg/dL    BLOOD 0.03 Negative, 0.03 mg/dL    UROBILINOGEN Normal Normal mg/dL   URINALYSIS, MICROSCOPIC  Result Value Ref Range    BACTERIA Negative Negative /hpf    MUCOUS Occasional (A) (none) /hpf    RBCS      WBCS      SQUAMOUS EPITHELIAL 3 <28 /hpf     Radiology:  Results for orders placed or performed during the hospital encounter of 02/24/22   XR CHEST PA AND LATERAL     Status: None    Narrative    Harjit A Vandenboom    RADIOLOGIST: Tempie Donning    XR CHEST PA AND LATERAL performed on 02/24/2022 7:12 PM    CLINICAL HISTORY: fever.  fever    TECHNIQUE: Frontal and lateral views of the  chest.    COMPARISON:  07/13/2014    FINDINGS:    The heart size is normal.  The mediastinal contour is unremarkable.  There are chronic-appearing changes of both lungs.   Degenerative and postsurgical changes of the visualized spine.        Impression    NO ACUTE FINDINGS.      Radiologist location ID: [id number]         ED COURSE/MEDICAL DECISION MAKING  Medications Administered in the ED   ibuprofen (MOTRIN) tablet (600 mg Oral Given 02/25/22 0126)   NS bolus infusion 1,000 mL (1,000 mL Intravenous New Bag/New Syringe 02/25/22 0203)   dexamethasone (PF) 10 mg/mL injection (10 mg Intravenous Given 02/25/22 0202)   piperacillin-tazobactam (ZOSYN) 4.5 g in NS 100 mL IVPB minibag (4.5 g Intravenous New Bag/New Syringe 02/25/22 0203)      ED Course as of 02/25/22 0328   Sun Feb 25, 2022   0327 C-REACTIVE PROTEIN (CRP)(!): 12.3  Elevated   0327 WBC(!): 3.6  Low   0328 CREATININE: 0.83  Normal      Medical Decision Making  The patient presented to the ED with fever and altered mental status.  T-max 105 according to family.  The patient did have a fever of 104.6 in the ED. the patient complains runny nose and cough.  Her husband had similar symptoms earlier in the week.  She states she does have mild shortness of breath on occasion.  Chest x-ray was negative per Radiology.  Admission was discussed with the patient and her family.  They do not feel admission is appropriate at this time.  They preferred to be discharged home.  She was given Zosyn in the ED and will be discharged home with Levaquin.  She will also be given an order to return stool to lab for C diff toxin testing if her diarrhea persists.    Amount and/or Complexity of Data Reviewed  Labs: ordered. Decision-making details documented in ED Course.          CLINICAL IMPRESSION  Clinical Impression   Fever, unspecified fever cause (Primary)   Symptoms of upper respiratory infection (URI)   Diarrhea, unspecified type   Dehydration     DISPOSITION  Discharged        DISCHARGE MEDICATIONS  Current Discharge Medication List      START taking these medications    Details   levoFLOXacin (LEVAQUIN) 750 mg Oral Tablet Take 1 Tablet (750 mg total) by mouth Once a day for 7 days  Qty: 7 Tablet, Refills: 0             Quita Skye Sabra Heck D.O.   02/25/2022, 01:44   [facility]  Department of Emergency Medicine  [facility]    -----

## 2022-02-25 NOTE — Discharge Instructions (Signed)
Levaquin as directed until gone.      Be sure to drink plenty of fluids    Follow-up with your doctor as soon as possible.      Return to the ED for persistent fever, worsening condition, and as needed.

## 2022-02-25 NOTE — ED Nurses Note (Signed)
Cough, congestion, fever, and sore throat started 2-3 days ago. Hooked up to monitor including cardiac, BP and oxygen. Spouse and son at bedside. Patient denies pain/ nausea at this time. Call light within reach

## 2022-02-26 ENCOUNTER — Inpatient Hospital Stay (HOSPITAL_COMMUNITY): Payer: Medicare Other

## 2022-02-26 ENCOUNTER — Emergency Department (EMERGENCY_DEPARTMENT_HOSPITAL): Payer: Medicare Other

## 2022-02-26 ENCOUNTER — Encounter (HOSPITAL_COMMUNITY): Payer: Self-pay

## 2022-02-26 ENCOUNTER — Emergency Department (HOSPITAL_COMMUNITY): Payer: Medicare Other

## 2022-02-26 ENCOUNTER — Inpatient Hospital Stay (HOSPITAL_COMMUNITY): Payer: Medicare Other | Admitting: HOSPITALIST-INTERNAL MEDICINE

## 2022-02-26 ENCOUNTER — Other Ambulatory Visit: Payer: Self-pay

## 2022-02-26 ENCOUNTER — Inpatient Hospital Stay
Admission: EM | Admit: 2022-02-26 | Discharge: 2022-03-03 | DRG: 481 | Disposition: A | Discharge status: Rehabilitation Facility | Payer: Medicare Other | Attending: Internal Medicine | Admitting: Internal Medicine

## 2022-02-26 DIAGNOSIS — M1990 Unspecified osteoarthritis, unspecified site: Secondary | ICD-10-CM | POA: Diagnosis present

## 2022-02-26 DIAGNOSIS — S72009A Fracture of unspecified part of neck of unspecified femur, initial encounter for closed fracture: Secondary | ICD-10-CM | POA: Diagnosis present

## 2022-02-26 DIAGNOSIS — S7001XA Contusion of right hip, initial encounter: Secondary | ICD-10-CM | POA: Diagnosis present

## 2022-02-26 DIAGNOSIS — J069 Acute upper respiratory infection, unspecified: Secondary | ICD-10-CM

## 2022-02-26 DIAGNOSIS — S72091A Other fracture of head and neck of right femur, initial encounter for closed fracture: Secondary | ICD-10-CM

## 2022-02-26 DIAGNOSIS — S72141A Displaced intertrochanteric fracture of right femur, initial encounter for closed fracture: Principal | ICD-10-CM | POA: Diagnosis present

## 2022-02-26 DIAGNOSIS — R4020 Unspecified coma: Secondary | ICD-10-CM

## 2022-02-26 DIAGNOSIS — R509 Fever, unspecified: Secondary | ICD-10-CM | POA: Diagnosis not present

## 2022-02-26 DIAGNOSIS — W1830XA Fall on same level, unspecified, initial encounter: Secondary | ICD-10-CM | POA: Diagnosis present

## 2022-02-26 DIAGNOSIS — W19XXXA Unspecified fall, initial encounter: Secondary | ICD-10-CM

## 2022-02-26 DIAGNOSIS — S72001A Fracture of unspecified part of neck of right femur, initial encounter for closed fracture: Secondary | ICD-10-CM

## 2022-02-26 DIAGNOSIS — Z7901 Long term (current) use of anticoagulants: Secondary | ICD-10-CM

## 2022-02-26 DIAGNOSIS — I4819 Other persistent atrial fibrillation: Secondary | ICD-10-CM | POA: Diagnosis present

## 2022-02-26 DIAGNOSIS — D649 Anemia, unspecified: Secondary | ICD-10-CM | POA: Diagnosis present

## 2022-02-26 DIAGNOSIS — I10 Essential (primary) hypertension: Secondary | ICD-10-CM | POA: Diagnosis present

## 2022-02-26 DIAGNOSIS — R55 Syncope and collapse: Secondary | ICD-10-CM

## 2022-02-26 DIAGNOSIS — N390 Urinary tract infection, site not specified: Secondary | ICD-10-CM | POA: Diagnosis present

## 2022-02-26 DIAGNOSIS — F419 Anxiety disorder, unspecified: Secondary | ICD-10-CM | POA: Diagnosis present

## 2022-02-26 DIAGNOSIS — Z5309 Procedure and treatment not carried out because of other contraindication: Secondary | ICD-10-CM | POA: Diagnosis not present

## 2022-02-26 DIAGNOSIS — R059 Cough, unspecified: Secondary | ICD-10-CM | POA: Diagnosis present

## 2022-02-26 DIAGNOSIS — R52 Pain, unspecified: Secondary | ICD-10-CM

## 2022-02-26 DIAGNOSIS — I081 Rheumatic disorders of both mitral and tricuspid valves: Secondary | ICD-10-CM | POA: Diagnosis present

## 2022-02-26 DIAGNOSIS — R112 Nausea with vomiting, unspecified: Secondary | ICD-10-CM | POA: Diagnosis present

## 2022-02-26 DIAGNOSIS — C9000 Multiple myeloma not having achieved remission: Secondary | ICD-10-CM | POA: Diagnosis present

## 2022-02-26 DIAGNOSIS — R791 Abnormal coagulation profile: Secondary | ICD-10-CM | POA: Diagnosis present

## 2022-02-26 DIAGNOSIS — R413 Other amnesia: Secondary | ICD-10-CM | POA: Diagnosis present

## 2022-02-26 DIAGNOSIS — I4891 Unspecified atrial fibrillation: Secondary | ICD-10-CM | POA: Diagnosis present

## 2022-02-26 DIAGNOSIS — E876 Hypokalemia: Secondary | ICD-10-CM | POA: Diagnosis present

## 2022-02-26 DIAGNOSIS — Z79899 Other long term (current) drug therapy: Secondary | ICD-10-CM

## 2022-02-26 HISTORY — DX: Malignant (primary) neoplasm, unspecified (CMS HCC): C80.1

## 2022-02-26 LAB — ECG 12 LEAD
Calculated R Axis: 96 degrees
Calculated T Axis: -39 degrees
QRS Duration: 114 ms
QT Interval: 400 ms
QTC Calculation: 478 ms
Ventricular rate: 86 {beats}/min

## 2022-02-26 LAB — MANUAL DIFFERENTIAL
BAND %: 3 % — ABNORMAL LOW (ref 5–11)
BANDS NEUTROPHILS MANUAL: 3
BASOPHIL %: 1 % (ref 0–3)
BASOPHIL ABSOLUTE: 0.05 10*3/uL (ref 0.00–0.30)
BASOPHILS MANUAL: 1
EOSINOPHIL %: 1 % (ref 0–7)
EOSINOPHIL ABSOLUTE: 0.05 10*3/uL (ref 0.00–0.80)
EOSINOPHILS MANUAL: 1
LYMPHOCYTE %: 21 % — ABNORMAL LOW (ref 25–45)
LYMPHOCYTE ABSOLUTE: 1.13 10*3/uL (ref 1.10–5.00)
LYMPHOCYTES MANUAL: 21
MONOCYTE %: 12 % (ref 0–12)
MONOCYTE ABSOLUTE: 0.65 10*3/uL (ref 0.00–1.30)
MONOCYTES MANUAL: 12
NEUTROPHIL %: 62 % (ref 40–76)
NEUTROPHIL ABSOLUTE: 3.51 10*3/uL (ref 1.80–8.40)
NEUTROPHILS MANUAL: 62
PLATELET MORPHOLOGY COMMENT: ADEQUATE
TOTAL CELLS COUNTED [#] IN BLOOD: 100
WBC: 5.4 10*3/uL

## 2022-02-26 LAB — COMPREHENSIVE METABOLIC PANEL, NON-FASTING
ALBUMIN/GLOBULIN RATIO: 1.2 (ref 0.8–1.4)
ALBUMIN: 3.8 g/dL (ref 3.5–5.7)
ALKALINE PHOSPHATASE: 63 U/L (ref 34–104)
ALT (SGPT): 16 U/L (ref 7–52)
ANION GAP: 12 mmol/L (ref 10–20)
AST (SGOT): 22 U/L (ref 13–39)
BILIRUBIN TOTAL: 0.6 mg/dL (ref 0.3–1.2)
BUN/CREA RATIO: 30 — ABNORMAL HIGH (ref 6–22)
BUN: 36 mg/dL — ABNORMAL HIGH (ref 7–25)
CALCIUM, CORRECTED: 9.7 mg/dL (ref 8.9–10.8)
CALCIUM: 9.5 mg/dL (ref 8.6–10.3)
CHLORIDE: 106 mmol/L (ref 98–107)
CO2 TOTAL: 24 mmol/L (ref 21–31)
CREATININE: 1.21 mg/dL (ref 0.60–1.30)
ESTIMATED GFR: 46 mL/min/{1.73_m2} — ABNORMAL LOW (ref >59)
GLOBULIN: 3.3 (ref 2.9–5.4)
GLUCOSE: 130 mg/dL — ABNORMAL HIGH (ref 74–109)
OSMOLALITY, CALCULATED: 293 mOsm/kg — ABNORMAL HIGH (ref 270–290)
POTASSIUM: 3.1 mmol/L — ABNORMAL LOW (ref 3.5–5.1)
PROTEIN TOTAL: 7.1 g/dL (ref 6.4–8.9)
SODIUM: 142 mmol/L (ref 136–145)

## 2022-02-26 LAB — THROAT CULTURE, BETA HEMOLYTIC STREPTOCOCCUS: THROAT CULTURE: NORMAL

## 2022-02-26 LAB — PT/INR
INR: 1.36 (ref <=5.00)
PROTHROMBIN TIME: 15.7 seconds — ABNORMAL HIGH (ref 9.8–12.7)

## 2022-02-26 LAB — PTT (PARTIAL THROMBOPLASTIN TIME): APTT: 26.6 seconds (ref 26.0–36.0)

## 2022-02-26 LAB — TROPONIN-I
TROPONIN I: 29 ng/L — ABNORMAL HIGH (ref <15)
TROPONIN I: 24 ng/L — ABNORMAL HIGH (ref <15)

## 2022-02-26 LAB — CBC WITH DIFF
HCT: 33.9 % — ABNORMAL LOW (ref 37.0–47.0)
HGB: 11.8 g/dL — ABNORMAL LOW (ref 12.5–16.0)
MCH: 31 pg (ref 27.0–32.0)
MCHC: 34.9 g/dL (ref 32.0–36.0)
MCV: 88.9 fL (ref 78.0–99.0)
MPV: 10 fL (ref 7.4–10.4)
PLATELETS: 183 10*3/uL (ref 140–440)
RBC: 3.82 10*6/uL — ABNORMAL LOW (ref 4.20–5.40)
RDW: 15.9 % — ABNORMAL HIGH (ref 11.6–14.8)
WBC: 5.4 10*3/uL (ref 4.0–10.5)
WBCS UNCORRECTED: 5.4 10*3/uL

## 2022-02-26 LAB — THYROID STIMULATING HORMONE (SENSITIVE TSH): TSH: 4.662 u[IU]/mL (ref 0.450–5.330)

## 2022-02-26 LAB — URINE CULTURE,ROUTINE: URINE CULTURE: 50000 — AB

## 2022-02-26 LAB — MAGNESIUM: MAGNESIUM: 2 mg/dL (ref 1.9–2.7)

## 2022-02-26 LAB — PHOSPHORUS: PHOSPHORUS: 2.8 mg/dL — ABNORMAL LOW (ref 3.7–7.2)

## 2022-02-26 MED ORDER — LEVOFLOXACIN 500 MG TABLET
750.0000 mg | ORAL_TABLET | Freq: Every day | ORAL | Status: DC
Start: 2022-02-26 — End: 2022-03-03
  Administered 2022-02-26: 750 mg via ORAL
  Administered 2022-02-27: 0 mg via ORAL
  Administered 2022-02-28 – 2022-03-03 (×4): 750 mg via ORAL
  Filled 2022-02-26 (×4): qty 1

## 2022-02-26 MED ORDER — HYDROMORPHONE 2 MG/ML INJECTION WRAPPER
1.0000 mg | INJECTION | INTRAMUSCULAR | Status: AC
Start: 2022-02-26 — End: 2022-02-26
  Administered 2022-02-26: 1 mg via INTRAVENOUS

## 2022-02-26 MED ORDER — HYDROMORPHONE 2 MG/ML INJECTION WRAPPER
1.0000 mg | INJECTION | INTRAMUSCULAR | Status: DC | PRN
Start: 2022-02-26 — End: 2022-02-27
  Administered 2022-02-26 – 2022-02-27 (×4): 1 mg via INTRAVENOUS
  Filled 2022-02-26 (×3): qty 1

## 2022-02-26 MED ORDER — VALSARTAN 160 MG TABLET
160.0000 mg | ORAL_TABLET | Freq: Every day | ORAL | Status: DC
Start: 2022-02-26 — End: 2022-02-27
  Administered 2022-02-26 – 2022-02-27 (×2): 0 mg via ORAL

## 2022-02-26 MED ORDER — METOPROLOL TARTRATE 25 MG TABLET
6.2500 mg | ORAL_TABLET | Freq: Two times a day (BID) | ORAL | Status: DC
Start: 2022-02-26 — End: 2022-02-27
  Administered 2022-02-26 – 2022-02-27 (×2): 6.25 mg via ORAL
  Filled 2022-02-26: qty 1

## 2022-02-26 MED ORDER — DOCUSATE SODIUM 100 MG CAPSULE
100.0000 mg | ORAL_CAPSULE | Freq: Two times a day (BID) | ORAL | Status: DC
Start: 2022-02-26 — End: 2022-03-03
  Administered 2022-02-26: 100 mg via ORAL
  Administered 2022-02-27: 0 mg via ORAL
  Administered 2022-02-27 – 2022-03-02 (×6): 100 mg via ORAL
  Administered 2022-03-02 – 2022-03-03 (×2): 0 mg via ORAL
  Filled 2022-02-26 (×8): qty 1

## 2022-02-26 MED ORDER — VALSARTAN 160 MG TABLET
ORAL_TABLET | ORAL | Status: AC
Start: 2022-02-26 — End: 2022-02-26
  Filled 2022-02-26: qty 1

## 2022-02-26 MED ORDER — DOCUSATE SODIUM 100 MG CAPSULE
ORAL_CAPSULE | ORAL | Status: AC
Start: 2022-02-26 — End: 2022-02-26
  Filled 2022-02-26: qty 1

## 2022-02-26 MED ORDER — HYDROCODONE 7.5 MG-ACETAMINOPHEN 325 MG TABLET
1.0000 | ORAL_TABLET | Freq: Four times a day (QID) | ORAL | Status: DC | PRN
Start: 2022-02-26 — End: 2022-02-27
  Administered 2022-02-26: 1 via ORAL

## 2022-02-26 MED ORDER — HYDROCHLOROTHIAZIDE 25 MG TABLET
ORAL_TABLET | ORAL | Status: AC
Start: 2022-02-26 — End: 2022-02-26
  Filled 2022-02-26: qty 1

## 2022-02-26 MED ORDER — HEPARIN (PORCINE) 25,000 UNIT/250 ML IN 0.45 % SODIUM CHLORIDE IV SOLN
INTRAVENOUS | Status: AC
Start: 2022-02-26 — End: 2022-02-26
  Filled 2022-02-26: qty 250

## 2022-02-26 MED ORDER — METOPROLOL TARTRATE 25 MG TABLET
ORAL_TABLET | ORAL | Status: AC
Start: 2022-02-26 — End: 2022-02-26
  Filled 2022-02-26: qty 1

## 2022-02-26 MED ORDER — LEVOFLOXACIN 500 MG TABLET
ORAL_TABLET | ORAL | Status: AC
Start: 2022-02-26 — End: 2022-02-26
  Filled 2022-02-26: qty 1

## 2022-02-26 MED ORDER — HEPARIN (PORCINE) 25,000 UNIT/250 ML IN 0.45 % SODIUM CHLORIDE IV SOLN
12.0000 [IU]/kg/h | INTRAVENOUS | Status: DC
Start: 2022-02-26 — End: 2022-02-27
  Administered 2022-02-26: 12 [IU]/kg/h via INTRAVENOUS
  Administered 2022-02-27: 0 [IU]/kg/h via INTRAVENOUS
  Administered 2022-02-27: 15 [IU]/kg/h via INTRAVENOUS

## 2022-02-26 MED ORDER — POLYETHYLENE GLYCOL 3350 17 GRAM ORAL POWDER PACKET
17.0000 g | Freq: Every day | ORAL | Status: DC
Start: 2022-02-26 — End: 2022-03-03
  Administered 2022-02-26 – 2022-02-28 (×3): 0 g via ORAL
  Administered 2022-03-01: 17 g via ORAL
  Administered 2022-03-02 – 2022-03-03 (×2): 0 g via ORAL
  Filled 2022-02-26 (×4): qty 1

## 2022-02-26 MED ORDER — DILTIAZEM 5 MG/ML INTRAVENOUS SOLUTION
10.0000 mg | Freq: Once | INTRAVENOUS | Status: AC
Start: 2022-02-26 — End: 2022-02-26
  Administered 2022-02-26: 10 mg via INTRAVENOUS

## 2022-02-26 MED ORDER — FENTANYL 50 MCG/HR TRANSDERMAL PATCH
1.0000 | MEDICATED_PATCH | TRANSDERMAL | Status: DC
Start: 2022-02-26 — End: 2022-03-03
  Administered 2022-02-26 – 2022-03-01 (×2): 0 via TRANSDERMAL

## 2022-02-26 MED ORDER — HYDROCHLOROTHIAZIDE 25 MG TABLET
12.5000 mg | ORAL_TABLET | Freq: Every day | ORAL | Status: DC
Start: 2022-02-26 — End: 2022-02-27
  Administered 2022-02-26 – 2022-02-27 (×2): 0 mg via ORAL

## 2022-02-26 MED ORDER — HYDROMORPHONE 2 MG/ML INJECTION WRAPPER
INJECTION | INTRAMUSCULAR | Status: AC
Start: 2022-02-26 — End: 2022-02-26
  Filled 2022-02-26: qty 1

## 2022-02-26 MED ORDER — LEVOFLOXACIN 250 MG TABLET
ORAL_TABLET | ORAL | Status: AC
Start: 2022-02-26 — End: 2022-02-26
  Filled 2022-02-26: qty 1

## 2022-02-26 MED ORDER — POTASSIUM CHLORIDE 20 MEQ/100ML IN STERILE WATER INTRAVENOUS PIGGYBACK
20.0000 meq | INJECTION | INTRAVENOUS | Status: AC
Start: 2022-02-26 — End: 2022-02-26
  Administered 2022-02-26: 20 meq via INTRAVENOUS
  Administered 2022-02-26: 0 meq via INTRAVENOUS

## 2022-02-26 MED ORDER — TELMISARTAN 80 MG-HYDROCHLOROTHIAZIDE 12.5 MG TABLET
1.0000 | ORAL_TABLET | Freq: Every day | ORAL | Status: DC
Start: 2022-02-26 — End: 2022-02-26

## 2022-02-26 MED ORDER — DILTIAZEM 5 MG/ML INTRAVENOUS SOLUTION
INTRAVENOUS | Status: AC
Start: 2022-02-26 — End: 2022-02-26
  Filled 2022-02-26: qty 5

## 2022-02-26 MED ORDER — HYDROCODONE 7.5 MG-ACETAMINOPHEN 325 MG TABLET
ORAL_TABLET | ORAL | Status: AC
Start: 2022-02-26 — End: 2022-02-26
  Filled 2022-02-26: qty 1

## 2022-02-26 MED ORDER — POTASSIUM CHLORIDE 20 MEQ/100ML IN STERILE WATER INTRAVENOUS PIGGYBACK
20.0000 meq | INJECTION | INTRAVENOUS | Status: AC
Start: 2022-02-26 — End: 2022-02-26
  Administered 2022-02-26: 0 meq via INTRAVENOUS
  Administered 2022-02-26: 20 meq via INTRAVENOUS

## 2022-02-26 MED ORDER — ONDANSETRON HCL (PF) 4 MG/2 ML INJECTION SOLUTION
4.0000 mg | Freq: Three times a day (TID) | INTRAMUSCULAR | Status: DC | PRN
Start: 2022-02-26 — End: 2022-03-03

## 2022-02-26 MED ORDER — POTASSIUM CHLORIDE 20 MEQ/100ML IN STERILE WATER INTRAVENOUS PIGGYBACK
INJECTION | INTRAVENOUS | Status: AC
Start: 2022-02-26 — End: 2022-02-26
  Filled 2022-02-26: qty 200

## 2022-02-26 NOTE — ED Nurses Note (Signed)
Rounded on patient.  Reviewed vital signs and treatment plan.  Asked if patient had any needs, especially in the area of toileting, pain management and general comfort.  Addressed issues. Family at bedside. Call bell within reach.

## 2022-02-26 NOTE — ED Nurses Note (Signed)
Patient received to room, placed on monitor, assessment and history completed. Call light in hand, encouraged to call for any needs. Awaiting providers orders at this time.

## 2022-02-26 NOTE — ED Triage Notes (Signed)
FALL AT [facility] TODAY WITH PAIN TO RIGHT SIDE. EMS REPORTS POSSIBLE SHORTENING TO RIGHT SIDE.

## 2022-02-26 NOTE — H&P (Signed)
[facility]  Hospitalist History & Physical      Date of admission:  02/26/22  Admitting physician:  Guadalupe Dawn. Elvina Sidle, DO      HPI:  Pt is 81F with a PMH significant for HTN & MM who presented to ED with CC of RIGHT hip pain.  Pt was at [facility] today & fell onto her RIGHT side.  Pt began noticing sx hours PTA while at [facility].  Pt states sx are constant.  Pt's pain is located in lateral aspect of RIGHT hip.  Pt describes pain as sharp in nature.  Pain scale = mvmt/10.  Pt states that mvmt aggravates & rest alleviates.  Pt denies assoc sx.  Pt denies F/C/N/V/D, SOB, or CP.  Pertinent negatives:  recent illness or travel, sick contacts.     In the ED, VS--T 35.5C, HR 73, RR 18, SpO2 = 100% on RA, BP 123/84 mmHg.     Of note, pt was recently seen in ED on 6/10 w/ fever (T max ~105F (per sign out)).  Pt was found to have UTI & was sent home on PO Levaquin.  Ortho consulted from ED & they plan to eval pt in the AM.     Labs remarkable for:        * CMP--K 3.1; BUN 36; Crt 1.21; eGFR 46; BG 130     * Coags--PT 15.7H; INR 1.36; aPTT 26.6     * Mg--WNL     * CBC--Hgb 11.8; Hct 33.9     * UA (6/10)--Turbid; Protein 100; Leuks 25     * UCx (6/10)--50,000 CFU/mL mixed flora     Imaging remarkable for:        * XR b/l Hips (6/12)--Intertrochanteric fx of RIGHT hip that is comminuted; lesser trochanter          displaced medially; moderate degenerative chgs; LEFT hip--No fx     * XR RIGHT Elbow (6/12)--NEG for fx     * CXR (6/12)--NEG     Diagnostics remarkable for:        * EKG (6/12)(personally reviewed)--Atrial fibrillation; RIGHT-axis deviation; ST depressions          in inferior leads (II, III, aVF)     SocHx:     Food:  Reg diet     Exercise:  Walks     Drugs:  Denies     Tob:  Denies     EtOH:  Denies     Caffeine:  Denies     Occ:  Retired     Living situation:  Lives at home w/ husband; married     PMH:     HTN     Multiple myeloma (MM)     Allergies:     Oxycodone (hives)     SurgHx:     Back  surgery (rods in back)     Hospitalizations, injuries, accidents, illnesses:     Recent ED visit on 6/10 for UTI     FamHx:     Father:  Denies significant or related PMH     Mother:  Denies significant or related PMH     Meds:     See Admission MedRec     ROS:  General:  Denies weight loss, fever, & chills.  HEENT:  Denies changes in vision & hearing.  Pulm:  Denies SOB & cough.  CV:  Denies CP or palpitations.  GI:   Denies abd pain; denies diarrhea.  GU:  Denies dysuria & urinary freq.  MSK:  Denies myalgia; +++sharp RIGHT hip pain+++.  Derm:  Denies rash & pruritis.  Neuro:  Denies headache & syncope.  Psych:  Denies recent changes in mood.  Denies anxiety & depression.    OBJECTIVE:       PHYSICAL EXAM:     ----------------------     General:  In NAD; AAOx4.     HEENT:  NCAT; MMM; EOMI; PERRLA; no scleral icterus noted.     CV:  +S1/S2; RRR; 0 m/r/g.     Pulm:  Lungs CTAB; 0 r/r/w.     GI:  Soft; NTND; +BSx4; 0 rebound/guarding.     MSK/Extremities:  No injury or deformity noted; 0 c/c/e.     Derm:  Warm; dry; intact in observable areas.     Neuro:  No focal deficits noted; CN II-XII grossly intact.     Psych:  Cooperative; approp mood & affect.       Current medications:     ----------------------------  .  Donavan Burnet by provider] fentaNYL (DURAGESIC) patch 50 mcg/hr 1 Patch Q72H  .  levoFLOXacin (LEVAQUIN) tablet 750 mg 750 mg Daily  .  potassium chloride 20 mEq in SW 100 mL premix infusion 20 mEq Now  .  potassium chloride 20 mEq in SW 100 mL premix infusion 20 mEq Now  .  [Held by provider] Telmisartan-Hydrochlorothiazid 80-12.5 mg Tablet 1 Tablet 1 Tablet Daily       Imaging & diagnostics:     ------------------------------     * XR b/l Hips (6/12)--Intertrochanteric fx of RIGHT hip that is comminuted; lesser trochanter          displaced medially; moderate degenerative chgs; LEFT hip--No fx     * XR RIGHT Elbow (6/12)--NEG for fx     * CXR (6/12)--NEG     * EKG (6/12)(personally reviewed)--Atrial fibrillation;  RIGHT-axis deviation; ST depressions          in inferior leads (II, III, aVF)    ASSESSMENT & PLAN:  --------------------------------  Pt is 62F with a PMH significant for HTN & MM who presented to ED with CC of RIGHT hip pain.        1. RIGHT Hip Intertrochanteric Fracture          * Pt presented to ED w/ CC of RIGHT hip pain sustained after mech fall at [facility] on 6/12          * Afebrile; no leukocytosis          * On exam, pt w/ RIGHT hip that is slightly externally rotated; RIGHT hip w/ mild TTP; RLE               nv intact; no hematomas noted          * XR b/l Hips (6/12)--Intertrochanteric fx of RIGHT hip that is comminuted; lesser               trochanter displaced medially; moderate degenerative chgs; LEFT hip--No fx          * XR RIGHT Elbow (6/12)--NEG for fx          * CXR (6/12)--NEG          * CT Head w/o (6/12)--ORDERED to r/o head trauma d/t pt being on AC & mild amnesia_          * GUPTA perioperative risk for MI, cardiac arrest intraoperatively or up to 30 days post op:  0.4%          * GUPTA postoperative risk from pneumonia/respiratory failure: 0.8%, 0.6% respectively          * RCRI:            - Rate of cardiac death, nonfatal MI, nonfatal cardiac arrest: 3.9%; Class I risk          * Functional capacity: >/=4 mets          * ASA class - 2          * EKG (6/12)(personally reviewed)--Atrial fibrillation; RIGHT-axis deviation; ST               depressions in inferior leads (II, III, aVF)          * CMP--K 3.1; BUN 36; Crt 1.21; eGFR 46; BG 130          * Coags--PT 15.7H; INR 1.36; aPTT 26.6          * Mg--WNL          * CBC--Hgb 11.8; Hct 33.9          * Pain ctrl PRN; cont hm Lortab; Dilaudid IV PRN for breakthrough pain          * Miralax + Docusate for bowel regimen          * Tylenol PRN for fever          * Zofran PRN for N/V          * T&S ORDERED; ordered 2U PRBCs ahead of planned surgery in AM          * DVT ppx w/ Heparin gtt          * Ortho consult--For surgery in ED           * Cardiology consult--For cardiac clearance given new-onset AFib          * PT/OT consult--For eval & tx          * CM/SocSvcs consult--Pt will likely need placement at rehab for several wks; D/C planning          * Pt admitted to monitored bed on cardiac diet & NPO after midnight          * Will continue to monitor pt & labs for any acute chgs       2. Hypokalemia (moderate)          * K (init) = 3.1; give given both PO & IV KCl in ED          * F/U CMP in AM daily       3. Atrial fibrillation          * Relatively new dx; on Eliquis at home for [facility]          * On exam, pt w/ HR intermittently in 140s          * EKG (6/12)(personally reviewed)--Atrial fibrillation; RIGHT-axis deviation; ST depressions              in inferior leads (II, III, aVF)          * Echo--ORDERED_          * On admission, started pt on Heparin gtt          * On admission, started pt on Metoprolol tartrate 6.25 mg PO BID for rate ctrl          * Cardizem IV for HR > 100 bpm          *  Cardiology consult--For cardiac clearance prior to anticipated Ortho surgery in AM        4. HTN          * Controlled; HOLD hm BP meds for now; SBP 123 mmHg on admission        5. Hx of Multiple myeloma (MM)          * Stable; monitoring per outpt HemeOnc       6. Anemia (mild)          * Likely 2/2 minor hematoma from RIGHT hip fx          * CBC--Hgb 11.8; Hct 33.9          * F/U CBC in AM daily       7. UTI          * Improving; pt presented to ED on 6/10 w/ CC of fever; pt found to have UTI          * UA (6/10)--Turbid; Protein 100; Leuks 25          * UCx (6/10)--50,000 CFU/mL mixed flora       GI/DVT ppx:  Not indicated/Heparin gtt  Advanced care plan (aged >= 49):     I spent 10 minutes speaking w/ the pt regarding advanced care planning.  This was      explained at length w/ the pt (& pt's family) & in layman's terms what these      decisions entail & the implications of such.  The pt & family acknowledged      understanding of their decisions.   They were given the opportunity to ask questions      multiple times.  All questions were answered.  The pt is aware that they may change      their code status and/or healthcare surrogate at any time & the means to do so was      explained.  The pt has expressed to me their advanced care planning wishes as below.     X Advanced Care Plan discussed; pt was not able to name a surrogate        decision maker or provide an advanced care plan     _ Has advanced care plan     _ Living will     X Healthcare surrogate:  Pt deciding  Barriers to discharge:  None  Code status:  FULL CODE  Commo:  See chart  MEDICAL DECISION-MAKING:      DDx comment:  RIGHT hip fx      Hx obtained from:  Pt, EMR      Reviewed diagnostic results (Labs, Radiology, Cardiology) from last 48 hours &          personally interpreted as noted:  Labs, XR Hips, CXR, EKG      Reviewed external documentation - findings/conclusions:  N/A      Detailed discussion of clinical findings & diagnostic results w/ pt;         Pt instructed to see their PCP for further monitoring of this          finding & mgmt of their hip fx & the need to admit.      Chronic illnesses posing threat to life or bodily function:  AFib      Moderate Risk of Morbidity.      Discussed tests & management with consultants:  N/A        Medications considered:  PO &  IV pain meds; Heparin      Monitoring drug therapy listed above for toxicity.      Social determinants (homeless, self-pay/uninsured, etc.)--None      Medication Review:  I have reviewed the meds listed below & considered them in          the tx of the pt.  Milestones to discharge:  Surgery completed; CBC stable; resp status baseline; placement in      SNF/rehab  NSOC:  SNF/rehab  Readmission risk:  Medium  Time of care = 50 minutes  Transition of care/post-discharge:  Pt to F/U w/ PCP in 5-7d.      Dispo:  Anticipated D/C on 14 JUN 23.

## 2022-02-26 NOTE — ED Provider Notes (Signed)
[facility]  Emergency Department  Attending Provider Note      CHIEF COMPLAINT  Chief Complaint   Patient presents with   . Hip Pain     HISTORY OF PRESENT ILLNESS  Katelyn Craig, date of birth 08-12-1941, is a 81 y.o. female who presented to the Emergency Department.    THE PATIENT FELL PRIOR TO ARRIVAL WITH PAIN AT THE RIGHT HIP.  SYMPTOMS WORSE WITH ACTIVITY BETTER WITH REST MODERATE IN NATURE.  THE DAUGHTER GAVE AN OXYCODONE PRIOR TO ARRIVAL.  NO CHEST PAIN PALPITATION SHORTNESS OF BREATH COUGH DYSURIA HEMATURIA NAUSEA VOMITING.  COMPREHENSIVE 10+ REVIEW OF SYSTEMS OTHERWISE NEGATIVE.  NO OTHER ACUTE COMPLAINTS REPORTED TO ME BY THE PATIENT.    PAST MEDICAL/SURGICAL/FAMILY/SOCIAL HISTORY  Past Medical History:   Diagnosis Date   . HTN (hypertension)    . Multiple myeloma (CMS [facility])        Past Surgical History:   Procedure Laterality Date   . HX BACK SURGERY         Family Medical History:    None       Social History     Socioeconomic History   . Marital status: Married   Tobacco Use   . Smoking status: Never   . Smokeless tobacco: Never   Substance and Sexual Activity   . Alcohol use: Never   . Drug use: Never      ALLERGIES  Allergies   Allergen Reactions   . Oxycodone Swelling     Throat swelling; takes hydrocodone at home       PHYSICAL EXAM  VITAL SIGNS:  Filed Vitals:    02/26/22 1231   BP: 123/84   Pulse: 73   Resp: 18   Temp: 35.5 C (95.9 F)   SpO2: 100%     GENERAL: PATIENT IS ALERT AND ORIENTED TO PERSON, PLACE, AND TIME.  HEAD: NORMOCEPHALIC AND ATRAUMATIC.  EYES: PUPILS EQUALLY ROUND AND REACT TO LIGHT. EXTRAOCULAR MOVEMENTS INTACT.  EARS: GROSS HEARING INTACT. EXTERNAL EARS WITHIN NORMAL LIMITS.  NOSE: NO SEPTAL DEVIATION. NASAL PASSAGES CLEAR.  THROAT: MOIST ORAL MUCOSA. NO ERYTHEMA OR EXUDATE OF THE PHARYNX.  NECK: SUPPLE. TRACHEA MIDLINE.  HEART:  IRREGULARLY IRREGULAR RATE AND RHYTHM.  LUNGS: CLEAR TO AUSCULTATION BILATERAL BUT DECREASED.  ABDOMEN: SOFT, NON-TENDER,  NON-DISTENDED, AND BOWEL SOUNDS ARE PRESENT.  GENITOURINARY: DEFERRED.  RECTAL: DEFERRED.  EXTREMITIES: NO CYANOSIS, CLUBBING, OR EDEMA.  SKIN: WARM AND DRY.  MUSCULOSKELETAL:  PAIN AT THE RIGHT HIP.  NEUROLOGIC: CRANIAL NERVES II THROUGH XII ARE GROSSLY INTACT. SENSATION TO LIGHT TOUCH IS INTACT.  PSYCHIATRIC: JUDGMENT AND INSIGHT ARE SEEMINGLY INTACT. MOOD AND AFFECT ARE APPROPRIATE FOR THE SITUATION.      DIAGNOSTICS  Labs:  Labs listed below were reviewed and interpreted by me.  Results for orders placed or performed during the hospital encounter of 02/26/22   COMPREHENSIVE METABOLIC PANEL, NON-FASTING   Result Value Ref Range    SODIUM 142 136 - 145 mmol/L    POTASSIUM 3.1 (L) 3.5 - 5.1 mmol/L    CHLORIDE 106 98 - 107 mmol/L    CO2 TOTAL 24 21 - 31 mmol/L    ANION GAP 12 10 - 20 mmol/L    BUN 36 (H) 7 - 25 mg/dL    CREATININE 1.21 0.60 - 1.30 mg/dL    BUN/CREA RATIO 30 (H) 6 - 22    ESTIMATED GFR 46 (L) >59 mL/min/1.68m2    ALBUMIN 3.8 3.5 - 5.7 g/dL  CALCIUM 9.5 8.6 - 10.3 mg/dL    GLUCOSE 130 (H) 74 - 109 mg/dL    ALKALINE PHOSPHATASE 63 34 - 104 U/L    ALT (SGPT) 16 7 - 52 U/L    AST (SGOT) 22 13 - 39 U/L    BILIRUBIN TOTAL 0.6 0.3 - 1.2 mg/dL    PROTEIN TOTAL 7.1 6.4 - 8.9 g/dL    ALBUMIN/GLOBULIN RATIO 1.2 0.8 - 1.4    OSMOLALITY, CALCULATED 293 (H) 270 - 290 mOsm/kg    CALCIUM, CORRECTED 9.7 8.9 - 10.8 mg/dL    GLOBULIN 3.3 2.9 - 5.4   PT/INR   Result Value Ref Range    PROTHROMBIN TIME 15.7 (H) 9.8 - 12.7 seconds    INR 1.36 <=5.00   PTT (PARTIAL THROMBOPLASTIN TIME)   Result Value Ref Range    APTT 26.6 26.0 - 36.0 seconds   MAGNESIUM   Result Value Ref Range    MAGNESIUM 2.0 1.9 - 2.7 mg/dL   ECG 12 LEAD   Result Value Ref Range    Ventricular rate 86 BPM    QRS Duration 114 ms    QT Interval 400 ms    QTC Calculation 478 ms    Calculated R Axis 96 degrees    Calculated T Axis -39 degrees     Radiology:  Results for orders placed or performed during the hospital encounter of 02/26/22   XR HIPS  BILATERAL W PELVIS 3-4 VIEWS     Status: None    Narrative    Placida A Mcphatter    RADIOLOGIST: James Patrizi    XR HIPS BILATERAL W PELVIS 3-4 VIEWS performed on 02/26/2022 1:39 PM    CLINICAL HISTORY: FALL.  FALL    TECHNIQUE:  5 total views  of both hips including AP pelvis.  COMPARISON:  None.    FINDINGS:  The bony pelvic ring appears intact.  SI joints are unremarkable.    RIGHT HIP:  Intratrochanteric fracture of the right hip that is comminuted. The lesser trochanter displaced medially.  Moderate degenerative changes.  Soft tissues are unremarkable.      LEFT HIP:  No fracture.  No suspicious bone lesion.  Moderate degenerative changes.  Soft tissues are unremarkable.        Impression    Intratrochanteric comminuted fracture of the right hip    No evidence of left hip fracture.          Radiologist location ID: [id number]     XR ELBOW RIGHT     Status: None    Narrative    Katelyn Craig    RADIOLOGIST: Karna Dupes    XR ELBOW RIGHT performed on 02/26/2022 1:41 PM.    CLINICAL HISTORY: FALL.  PAIN AFTER FALL    TECHNIQUE:  3 views of the right elbow    COMPARISON:  None.    FINDINGS:   No acute fracture or joint dislocation is identified.  No joint effusion is seen.  No radiopaque foreign body is identified.        Impression    No acute osseous abnormality.           Radiologist location ID: [id number]     XR CHEST AP     Status: None    Narrative    Katelyn Craig      PROCEDURE DESCRIPTION: XRT CHEST ONE VIEW    CLINICAL :  FALL/URI    COMPARISON:None  FINDINGS: A single view of the chest was obtained. No focal alveolar infiltrate is identified.  The heart size is at the upper limits of normal. Minimal blunting of the left costophrenic angle is seen. Marked narrowing of the left subacromial space is seen. 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      Impression    No focal alveolar infiltrate is identified.        Radiologist location ID: [id number]          ED COURSE/MEDICAL DECISION MAKING  Medications Administered in the ED   potassium chloride 20 mEq in SW 100 mL premix infusion (has no administration in time range)   potassium chloride 20 mEq in SW 100 mL premix infusion (has no administration in time range)      ED Course as of 02/26/22 1410   Mon Feb 26, 2022   1320 XR HIPS BILATERAL W PELVIS 3-4 VIEWS  TODAY I, Servando Kyllonen A. Fallynn Gravett, D.O., PERSONALLY VISUALIZED THE PATIENT'S DIAGNOSTIC IMAGING STUDIES.  DEFER TO THE RADIOLOGIST'S REPORT FOR THE FINAL DEFINITIVE RADIOLOGY READING.  MY INITIAL INDEPENDENT INTERPRETATION IS AS FOLLOWS, BUT NOT LIMITED TO:   RIGHT HIP FX   1322 ECG 12 LEAD  TODAY I, Kasem Mozer A. Ramisa Duman, D.O., PERSONALLY VISUALIZED THE PATIENT'S EKG TRACING.  I AM THE PRIMARY INTERPRETER.  ATRIAL FIBRILLATION AT A RATE OF 86.  RIGHT AXIS DEVIATION.  BASELINE WANDERING WITH ARTIFACT.  NONSPECIFIC ST-T CHANGES.  LIKELY LVH.  NO BUNDLE-BRANCH BLOCKS.   1407 CREATININE: 1.21  WNL   1407 MAGNESIUM: 2.0  WNL   1407 INR: 1.36  WNL      Medical Decision Making  Acute hypokalemia: acute illness or injury  Closed right hip fracture (CMS HCC): acute illness or injury  Fall: acute illness or injury  Amount and/or Complexity of Data Reviewed  Labs: ordered. Decision-making details documented in ED Course.  Radiology: ordered and independent interpretation performed. Decision-making details documented in ED Course.  ECG/medicine tests: ordered and independent interpretation performed. Decision-making details documented in ED Course.      Risk  Prescription drug management.  Decision regarding hospitalization.  Diagnosis or treatment significantly limited by social determinants of health.  Elective major surgery with identified risk factors.        CLINICAL IMPRESSION  Clinical Impression   Closed right hip fracture (CMS HCC) (Primary)   Fall   Acute hypokalemia     DISPOSITION  Admitted       DISCHARGE MEDICATIONS  Current Discharge Medication List          /Evanthia Maund  A. [place], DO, MBA   02/26/2022, 13:20   [facility]  Department of Emergency Medicine  [facility]    This note was partially generated using MModal Fluency Direct system, and there may be some incorrect words, spellings, and punctuation that were not noted in checking the note before saving.

## 2022-02-27 ENCOUNTER — Encounter (HOSPITAL_COMMUNITY)
Admission: EM | Disposition: A | Discharge status: Rehabilitation Facility | Payer: Self-pay | Source: Home / Self Care | Attending: Family Medicine

## 2022-02-27 ENCOUNTER — Inpatient Hospital Stay (HOSPITAL_COMMUNITY): Payer: Medicare Other

## 2022-02-27 DIAGNOSIS — I10 Essential (primary) hypertension: Secondary | ICD-10-CM

## 2022-02-27 DIAGNOSIS — C9000 Multiple myeloma not having achieved remission: Secondary | ICD-10-CM | POA: Diagnosis present

## 2022-02-27 DIAGNOSIS — I4891 Unspecified atrial fibrillation: Secondary | ICD-10-CM | POA: Diagnosis present

## 2022-02-27 LAB — BASIC METABOLIC PANEL
ANION GAP: 9 mmol/L — ABNORMAL LOW (ref 10–20)
BUN/CREA RATIO: 25 — ABNORMAL HIGH (ref 6–22)
BUN: 24 mg/dL (ref 7–25)
CALCIUM: 8.7 mg/dL (ref 8.6–10.3)
CHLORIDE: 103 mmol/L (ref 98–107)
CO2 TOTAL: 27 mmol/L (ref 21–31)
CREATININE: 0.96 mg/dL (ref 0.60–1.30)
ESTIMATED GFR: 61 mL/min/{1.73_m2} (ref >59)
GLUCOSE: 132 mg/dL — ABNORMAL HIGH (ref 74–109)
OSMOLALITY, CALCULATED: 284 mOsm/kg (ref 270–290)
POTASSIUM: 3 mmol/L — ABNORMAL LOW (ref 3.5–5.1)
SODIUM: 139 mmol/L (ref 136–145)

## 2022-02-27 LAB — CBC
HCT: 32.2 % — ABNORMAL LOW (ref 37.0–47.0)
HGB: 11.2 g/dL — ABNORMAL LOW (ref 12.5–16.0)
MCH: 31 pg (ref 27.0–32.0)
MCHC: 34.7 g/dL (ref 32.0–36.0)
MCV: 89.2 fL (ref 78.0–99.0)
MPV: 10 fL (ref 7.4–10.4)
PLATELETS: 179 10*3/uL (ref 140–440)
RBC: 3.61 10*6/uL — ABNORMAL LOW (ref 4.20–5.40)
RDW: 15.8 % — ABNORMAL HIGH (ref 11.6–14.8)
WBC: 5.1 10*3/uL (ref 4.0–10.5)
WBCS UNCORRECTED: 5.1 10*3/uL

## 2022-02-27 LAB — PT/INR
INR: 1.5 (ref <=5.00)
PROTHROMBIN TIME: 17.3 seconds — ABNORMAL HIGH (ref 9.8–12.7)

## 2022-02-27 LAB — MAGNESIUM: MAGNESIUM: 1.7 mg/dL — ABNORMAL LOW (ref 1.9–2.7)

## 2022-02-27 LAB — PTT (PARTIAL THROMBOPLASTIN TIME)
APTT: 32.5 seconds (ref 26.0–36.0)
APTT: 48.2 seconds — ABNORMAL HIGH (ref 26.0–36.0)

## 2022-02-27 SURGERY — INSERTION NAIL GAMMA FEMUR
Site: Hip | Laterality: Right | Wound class: Clean Wound: Uninfected operative wounds in which no inflammation occurred

## 2022-02-27 MED ORDER — DILTIAZEM 5 MG/ML INTRAVENOUS SOLUTION
10.0000 mg | INTRAVENOUS | Status: DC
Start: 2022-02-27 — End: 2022-02-27

## 2022-02-27 MED ORDER — METOPROLOL TARTRATE 5 MG/5 ML INTRAVENOUS SOLUTION
5.0000 mg | INTRAVENOUS | Status: AC
Start: 2022-02-27 — End: 2022-02-27

## 2022-02-27 MED ORDER — METOPROLOL SUCCINATE ER 25 MG TABLET,EXTENDED RELEASE 24 HR
12.5000 mg | ORAL_TABLET | Freq: Two times a day (BID) | ORAL | Status: DC
Start: 2022-02-27 — End: 2022-02-27
  Administered 2022-02-27: 0 mg via ORAL

## 2022-02-27 MED ORDER — DILTIAZEM 30 MG TABLET
30.0000 mg | ORAL_TABLET | Freq: Four times a day (QID) | ORAL | Status: DC
Start: 2022-02-27 — End: 2022-02-27

## 2022-02-27 MED ORDER — METOPROLOL TARTRATE 5 MG/5 ML INTRAVENOUS SOLUTION
INTRAVENOUS | Status: AC
Start: 2022-02-27 — End: 2022-02-27
  Administered 2022-02-27: 5 mg
  Filled 2022-02-27: qty 5

## 2022-02-27 MED ORDER — ACETAMINOPHEN 325 MG TABLET
650.0000 mg | ORAL_TABLET | ORAL | Status: DC | PRN
Start: 2022-02-27 — End: 2022-02-27
  Administered 2022-02-27: 650 mg via ORAL
  Filled 2022-02-27: qty 2

## 2022-02-27 MED ORDER — HYDROMORPHONE 2 MG/ML INJECTION WRAPPER
1.2000 mg | INJECTION | INTRAMUSCULAR | Status: DC | PRN
Start: 2022-02-27 — End: 2022-03-03
  Administered 2022-02-27 – 2022-03-01 (×6): 1.2 mg via INTRAVENOUS
  Filled 2022-02-27 (×6): qty 1

## 2022-02-27 MED ORDER — HEPARIN (PORCINE) 1,000 UNIT/ML INJECTION SOLUTION
3290.0000 [IU] | Freq: Once | INTRAMUSCULAR | Status: AC
Start: 2022-02-27 — End: 2022-02-27
  Administered 2022-02-27: 3290 [IU] via INTRAVENOUS
  Filled 2022-02-27: qty 4

## 2022-02-27 MED ORDER — POTASSIUM CHLORIDE 20 MEQ/100ML IN STERILE WATER INTRAVENOUS PIGGYBACK
20.0000 meq | INJECTION | INTRAVENOUS | Status: AC
Start: 2022-02-27 — End: 2022-02-27
  Administered 2022-02-27 (×2): 0 meq via INTRAVENOUS
  Administered 2022-02-27 (×2): 20 meq via INTRAVENOUS
  Filled 2022-02-27 (×2): qty 100

## 2022-02-27 MED ORDER — GUAIFENESIN ER 600 MG TABLET, EXTENDED RELEASE 12 HR
600.0000 mg | EXTENDED_RELEASE_TABLET | Freq: Two times a day (BID) | ORAL | Status: DC
Start: 2022-02-27 — End: 2022-03-03
  Administered 2022-02-27: 0 mg via ORAL
  Administered 2022-02-27 – 2022-03-03 (×8): 600 mg via ORAL
  Filled 2022-02-27 (×8): qty 1

## 2022-02-27 MED ORDER — LORAZEPAM 0.5 MG TABLET
0.5000 mg | ORAL_TABLET | Freq: Four times a day (QID) | ORAL | Status: DC | PRN
Start: 2022-02-27 — End: 2022-03-03
  Administered 2022-02-27 – 2022-02-28 (×3): 0.5 mg via ORAL
  Filled 2022-02-27 (×3): qty 1

## 2022-02-27 MED ORDER — VALSARTAN 80 MG TABLET
80.0000 mg | ORAL_TABLET | Freq: Two times a day (BID) | ORAL | Status: DC
Start: 2022-02-27 — End: 2022-03-03
  Administered 2022-02-27 – 2022-03-03 (×8): 80 mg via ORAL
  Filled 2022-02-27 (×8): qty 1

## 2022-02-27 MED ORDER — ETHYL ALCOHOL 62 % (NOZIN NASAL SANITIZER) NASAL SOLUTION - BULK BOTTLE
1.0000 | Freq: Two times a day (BID) | NASAL | Status: DC
Start: 2022-02-27 — End: 2022-03-03
  Administered 2022-02-27 – 2022-03-03 (×11): 1 via NASAL

## 2022-02-27 MED ORDER — METOPROLOL SUCCINATE ER 25 MG TABLET,EXTENDED RELEASE 24 HR
25.0000 mg | ORAL_TABLET | Freq: Two times a day (BID) | ORAL | Status: DC
Start: 2022-02-27 — End: 2022-03-01
  Administered 2022-02-27 – 2022-03-01 (×4): 25 mg via ORAL
  Filled 2022-02-27 (×4): qty 1

## 2022-02-27 MED ORDER — HYDROCODONE 10 MG-ACETAMINOPHEN 325 MG TABLET
1.0000 | ORAL_TABLET | Freq: Four times a day (QID) | ORAL | Status: DC | PRN
Start: 2022-02-27 — End: 2022-03-03
  Administered 2022-02-28 – 2022-03-03 (×8): 1 via ORAL
  Filled 2022-02-27 (×9): qty 1

## 2022-02-27 MED ORDER — METOPROLOL TARTRATE 5 MG/5 ML INTRAVENOUS SOLUTION
5.0000 mg | Freq: Once | INTRAVENOUS | Status: AC
Start: 2022-02-27 — End: 2022-02-27
  Administered 2022-02-27: 5 mg via INTRAVENOUS
  Filled 2022-02-27: qty 5

## 2022-02-27 MED ORDER — HEPARIN (PORCINE) 5,000 UNIT/ML INJECTION SOLUTION
3290.0000 [IU] | Freq: Once | INTRAMUSCULAR | Status: DC
Start: 2022-02-27 — End: 2022-02-27

## 2022-02-27 NOTE — Care Plan (Signed)
Medical Nutrition Therapy Assessment    Assessed due to hx multiple myeloma.    SUBJECTIVE : Patient off of floor to pre op.     OBJECTIVE:   PMH includes A Fib, multiple myeloma.  Per EMR, multiple myeloma is stable.  5'8", 147 lbs, BMI 22.46  Current Diet Order/Nutrition Support:  No diet orders on file     Plan/Interventions : Follow up tomorrow for further nutrition assessment.    Nolon Nations, RDLD  02/27/2022, 13:43

## 2022-02-27 NOTE — Consults (Signed)
ORTHOPAEDIC SURGERY CONSULT NOTE:    Patient Name: Katelyn Craig   MRN: [id number]   Date of Birth: 27-Nov-1940  Age: 81 y.o.    Gender: female  Attending Physician: Salvatore Marvel, MD    Date of Consultation: 02/27/2022    Reason for consult:  Right intertrochanteric femur fracture     Subjective:    HPI:  Katelyn Craig is a 81 y.o. female who presents to [facility] Emergency Department on 02/26/2021 status post mechanical fall while at [facility] onto her right side.  Experienced immediate pain about the right hip and inability to ambulate.  X-rays upon arrival demonstrated comminuted right intertrochanteric femur fracture for which Orthopedics was consulted.  Dr. Lacey Jensen was originally contacted by the emergency department who recommended admission to the Internal Medicine Service.  Dr. Lacey Jensen asked if I would assume care from an orthopedic perspective.  I did agree.    Patient was seen evaluated this morning for formal consultation.  States that she is a history of hypertension and multiple myeloma.  Reports mechanical fall while at [employer] yesterday.  During the encounter this morning she reported pain globally about the right hip without significant radiation.  Denies paralysis or paresthesias to the right lower extremity.  Also reports mild right shoulder pain that is improving in nature.  States she is able to range the shoulder without difficulty.  Denied trauma to the head or neck.  Denies any current neck pain.  No loss of consciousness.  No nausea vomiting fevers chills.    REVIEW OF SYSTEMS  General: No fatigue, fevers, chills  Ear, nose and throat: No dysphagia or nosebleeds.   Eyes: No change in vision.   Cardiovascular: No chest pain, dyspnea on exertion. No abnormal heartbeat.    Lung: No cough or shortness of breath.   Skin: No rash.   Neurological: No seizures, tremors or headaches.   Psychiatric: No hallucinations or psychosis     PMH:  Past Medical History:   Diagnosis Date   . Cancer (CMS [place])    . HTN  (hypertension)    . Multiple myeloma (CMS [facility])         PSH:  Past Surgical History:   Procedure Laterality Date   . Hx back surgery          ALLERGIES:  Allergies   Allergen Reactions   . Oxycodone Swelling     Throat swelling; takes hydrocodone at home        Social History:  Social History     Socioeconomic History   . Marital status: Married   Tobacco Use   . Smoking status: Never     Passive exposure: Never   . Smokeless tobacco: Never   Vaping Use   . Vaping Use: Never used   Substance and Sexual Activity   . Alcohol use: Never   . Drug use: Never   . Sexual activity: Not Currently        Objective:    Last Filed Vitals:  Filed Vitals:    02/27/22 0806 02/27/22 0949 02/27/22 1016 02/27/22 1109   BP:    (!) 184/101   Pulse: (!) 170  96 (!) 103   Resp:  16  18   Temp:    36.7 C (98.1 F)   SpO2:    94%        Constitutional: Appears comfortable, no acute distress.  Alert and oriented x 3 .  Cardiovascular: No peripheral edema.  Respiratory: Normal respiratory effort  Head: NCAT    Musculoskeletal Exam:     Right lower extremity:  No gross deformity appreciated.  No open wounds abrasions ecchymosis or significant swelling globally about the right lower extremity particularly about the hip.  Positive logroll.  Positive Stinchfield.  Stable pelvic rock.  Patient is appropriately tender to palpation globally about the right hip.  Nontender to palpation distal to the hip in the right lower extremity.  Motor and sensation grossly intact.  Compartments soft compressible.  Negative Homan or evidence of DVT.    Evaluated the patient's right upper extremity and right shoulder as well.  No gross deformity appreciated.  She demonstrates full active range of motion of the shoulder.  Minimally tender to palpation globally about the shoulder.  Motor and sensation grossly intact to the right upper extremity and exam unremarkable.    Radiographs:   Results for orders placed or performed during the hospital encounter of 02/26/22  (from the past 72 hour(s))   XR HIPS BILATERAL W PELVIS 3-4 VIEWS     Status: None    Narrative    Katelyn Craig    RADIOLOGIST: Katelyn Craig    XR HIPS BILATERAL W PELVIS 3-4 VIEWS performed on 02/26/2022 1:39 PM    CLINICAL HISTORY: FALL.  FALL    TECHNIQUE:  5 total views  of both hips including AP pelvis.  COMPARISON:  None.    FINDINGS:  The bony pelvic ring appears intact.  SI joints are unremarkable.    RIGHT HIP:  Intratrochanteric fracture of the right hip that is comminuted. The lesser trochanter displaced medially.  Moderate degenerative changes.  Soft tissues are unremarkable.      LEFT HIP:  No fracture.  No suspicious bone lesion.  Moderate degenerative changes.  Soft tissues are unremarkable.        Impression    Intratrochanteric comminuted fracture of the right hip    No evidence of left hip fracture.          Radiologist location ID: [id number]     XR ELBOW RIGHT     Status: None    Narrative    Katelyn Craig    RADIOLOGIST: Katelyn Craig    XR ELBOW RIGHT performed on 02/26/2022 1:41 PM.    CLINICAL HISTORY: FALL.  PAIN AFTER FALL    TECHNIQUE:  3 views of the right elbow    COMPARISON:  None.    FINDINGS:   No acute fracture or joint dislocation is identified.  No joint effusion is seen.  No radiopaque foreign body is identified.        Impression    No acute osseous abnormality.           Radiologist location ID: [id number]     XR CHEST AP     Status: None    Narrative    Katelyn Craig      PROCEDURE DESCRIPTION: XRT CHEST ONE VIEW    CLINICAL :  FALL/URI    COMPARISON:None          FINDINGS: A single view of the chest was obtained. No focal alveolar infiltrate is identified.  The heart size is at the upper limits of normal. Minimal blunting of the left costophrenic angle is seen. Marked narrowing of the left subacromial space is seen. 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      Impression    No focal alveolar infiltrate is  identified.  Radiologist location ID: [id number]     CT BRAIN WO IV CONTRAST     Status: None    Narrative    Saren A Fountain    RADIOLOGIST: Katelyn Craig    CT BRAIN WO IV CONTRAST performed on 02/26/2022 3:32 PM    CLINICAL HISTORY: fall, loc.  no head impact.  fall, loc.  no head impact    TECHNIQUE:  Head CT without intravenous contrast.    COMPARISON: None.    FINDINGS:  No acute intracranial hemorrhage, mass effect or subacute transcortical infarction is identified.    Brain: Moderate multifocal and patchy areas of low-attenuation are seen within the periventricular and subcortical cerebral white matter bilaterally most compatible with small vessel ischemic change. 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 The cerebellar tonsils are in normal position.    CSF Spaces: Mild generalized prominence of the ventricular system and basilar cisterns with widening of the cortical sulci present compatible global age-related volume loss and atrophy. No abnormal hyperdense extra-axial fluid collection is identified. Calcified atherosclerotic changes of the bilateral intracranial internal carotid arteries present.     Sinuses/Mastoids:  Circumferential mucosal thickening involving the visualized bilateral maxillary sinuses present. Mucosal thickening and partial opacification of bilateral ethmoid air cells also present. Minimal mucosal thickening of the bilateral sphenoid sinus also noted. Mastoid air cells and middle ear cavities are well-aerated bilaterally.     Bones: No acute osseous abnormality or osseous destructive lesion.        Impression    NO ACUTE FINDINGS      One or more dose reduction techniques were used (e.g., Automated exposure control, adjustment of the mA and/or kV according to patient size, use of iterative reconstruction technique).      Radiologist location ID: [id number]     XR FEMUR RIGHT     Status: None    Narrative    Bryant A  Teague    RADIOLOGIST: Ileene Hutchinson    XR FEMUR RIGHT- 2 VIEWS performed on 02/26/2022 7:28 PM    CLINICAL HISTORY: fracture.  known rt hip fx     TECHNIQUE:  2 views of the right femur.    COMPARISON:  Hip radiographs dated 02/26/2022    FINDINGS:   There is redemonstration of a right femoral proximal intertrochanteric fracture. The remainder of the femur is unremarkable.  Normal alignment at the hip and knee.  Soft tissues are unremarkable.        Impression    Redemonstration of a right femoral proximal intertrochanteric fracture. Otherwise unremarkable right femur radiographs.        Radiologist location ID: [id number]          Lab Data   Results from last 7 days   Lab Units 02/27/22  0437 02/26/22  1325 02/24/22  2016   CO2 mmol/L 27 24  --    BUN mg/dL 24 36*  --    CREA mg/dL 0.96 1.21  --    GLUC mg/dL  --   --  Negative   CA mg/dL 8.7 9.5  --    ALKP U/L  --  63  --    SGPT U/L  --  16  --    SGOT U/L  --  22  --      Lab Results   Component Value Date    WBC 5.1 02/27/2022    HGB 11.2 (L) 02/27/2022    HCT 32.2 (  L) 02/27/2022     Lab Results   Component Value Date    APTT 48.2 (H) 02/27/2022    INR 1.50 02/27/2022     Lab Results   Component Value Date    TROPONINI 24 (H) 02/26/2022     No results found for: HGBA1C    Microbiology:      URINE CULTURE   Date Value Ref Range Status   02/24/2022 50,000 CFU/mL Mixed Flora (A)  Final     INFLUENZA VIRUS TYPE A   Date Value Ref Range Status   02/24/2022 Not Detected Not Detected Final     INFLUENZA VIRUS TYPE B   Date Value Ref Range Status   02/24/2022 Not Detected Not Detected Final           Impression and Plan:    Impression:   1. Comminuted right intertrochanteric femur fracture  2. Multiple myeloma  3. Hypertension     Plan:  1. Imaging was reviewed at length at bedside with both the patient and family this morning.  Treatment options were discussed.  I did recommend proceeding with operative intervention consisting of stabilization with a long  cephalomedullary nail.  Procedure was discussed in great detail.  Risks benefits potential complications and outcomes were also discussed and patient verbalized understanding and provided informed consent proceed.  2. Originally planned to perform the procedure today in anticipation of the patient's INR trending down from 1.36 yesterday as her Eliquis was held.  Repeat INR ordered today showed an elevation up to 1.5. Surgery was canceled for today  3. Plan for ORIF right hip on 02/28/2022  4. Continue to hold Eliquis  5. Anticoagulation per primary service.  If patient is placed back onto a heparin drip please hold heparin at 10:00 a.m. on 02/28/2022 as surgery is scheduled for 4:00 p.m.  6. Patient has been seen and cleared by Cardiology  7. NPO after midnight  8. A.m. labs ordered including repeat INR  9. Please do not hesitate to contact Orthopedics with any questions or concerns      Rebeca Alert, D.O.  02/27/22 [place]  Please call with any questions/concerns    This note has been created with voice recognition software.  Please excuse any errors in transcription.  Occasional wrong word or sound alike substitutions may have occurred due to the inherent limitations of voice recognition software.  Please read the chart carefully and recognize using context with the substitutions may have occurred.

## 2022-02-27 NOTE — Progress Notes (Signed)
Hospitalist Event Note:    Received call stating that the patient was having heart rate as high as 160's atrial fib.  The patient is NPO for possible surgery tomorrow for repair of hip fracture.  The patient was given Lopressor 5 mg IV x 1 with no significant improvement.  Daughter at bedside stated that the patient was seen in the ER 2 days ago for respiratory symptoms.  She has had intermittent fever since that time.  The patient was very warm to touch and temp 100.9.  The patient has history of anxiety as well and takes ativan at home prn for anxiety.  She has congested cough, sat dropped to 85% on 2 liters with talking.  Oxygen increased to 3 liters NC.  Tylenol ordered prn for fever and ativan ordered prn for anxiety.  Mucinex ordered to assist with clearing of secretions.  Incentive spirometer ordered for optimum lung expansion.  Blood cultures and labs ordered this am.  Further orders will depend upon clinical course.  The Hospitalist personally evaluated and examined the patient in conjunction with the MLP and agree with the assessments, treatment plan and disposition of the patient as recorded by the [facility].

## 2022-02-27 NOTE — OR PreOp (Signed)
SURGERY CANCELLED BY DR Kathlen Brunswick DUE ABNORMAL LAB VALUES.

## 2022-02-27 NOTE — Care Management Notes (Signed)
[facility] Management Initial Evaluation    Patient Name: Katelyn Craig  Date of Birth: 09-Aug-1941  Sex: female  Date/Time of Admission: 02/26/2022 12:29 PM  Room/Bed: 367/A  Payor: MEDICARE / Plan: MEDICARE PART A AND B / Product Type: Medicare /   Primary Care Providers:  Sammie Bench, NP, CNP (General)    Pharmacy Info:   Preferred Pharmacy       [place] - [address], [address]    [address] [place] [address]    Phone: [phone] Fax: [phone]    Hours: Not open 24 hours          Emergency Contact Info:   Extended Emergency Contact Information  Primary Emergency Contact: Sturgeon Phone: [phone]  Relation: Husband  Preferred language: English  Secondary Emergency Contact: Hickman  Mobile Phone: [phone]  Relation: Son  Preferred language: English    History:   Katelyn Craig is a 81 y.o., female, admitted 02/26/22.    Height/Weight: 172.7 cm (5' 8" ) / 67 kg (147 lb 11.2 oz)     LOS: 1 day   Admitting Diagnosis: Hip fracture (CMS [place]) [S72.009A]    Assessment:      02/27/22 1650   Assessment Details   Assessment Type Admission   Date of Care Management Update 02/27/22   Insurance Information/Type   Insurance type Medicare   Employment/Financial   Patient has Prescription Coverage?  Yes        Name of Insurance Coverage for Medications Medicare   Financial Concerns none   Living Environment   Select an age group to open "lives with" row.  Adult   Lives With spouse   Living Arrangements house   Able to Return to Prior Arrangements yes  (after rehab)   IEP and/or 504 Plan? No   Home Safety   Home Assessment: No Problems Identified   Home Accessibility stairs within home   Custody and Legal Status   Do you have a court appointed guardian/conservator? No   Are you an emancipated minor? No   Custody Issues? No   Paternity Affidavit Requested? No   Care Management Plan   Discharge Planning Status initial meeting    Projected Discharge Date 03/02/22   Discharge plan discussed with: Patient   CM will evaluate for rehabilitation potential yes   Patient choice offered to patient/family yes   Facility or Agency Preferences [facility]   Discharge Needs Assessment   Outpatient/Agency/Support Group Needs inpatient rehabilitation facility   Equipment Currently Used at Home none   Equipment Needed After Discharge walker, rolling   Discharge Facility/Level of Care Needs Acute Rehab Placement/Return (not psych)(code 85)   Transportation Available agency transportation   Referral Information   Admission Type inpatient   Arrived From home or self-care   Stairs Within Home, Primary   Number of Stairs, Within Home, Primary none     Pt admitted with hip fracture. Pt will be having surgery tomorrow due to having an INR of 1.5. CM met with pt at bedside. Pt was alert and oriented to all spheres. Pt lives at home with her husband. She reported that there are 7 steps to get in her front door. There are steps to get into every entrance, but then it is all one level. Pt wants to go to [facility] prior to returning home with her husband. Pt did not use any equipment at home. Pt  did not wear oxygen at home. CM made referral to Roxanne at [facility] and will follow up on status of referral.     Discharge Plan:  Acute Rehab Placement/Return (not psych) (code 41)      The patient will continue to be evaluated for developing discharge needs.     Case Manager: Doroteo Bradford, [place]  Phone: [phone]

## 2022-02-27 NOTE — Progress Notes (Signed)
[facility]   IP PROGRESS NOTE      Keneshia, Tena A  Date of Admission:  02/26/2022  Date of Birth:  [birth year]  Date of Service:  02/27/2022    Hospital Day:  LOS: 1 day         HPI:  Katelyn Craig is a 81 y.o. female admitted with right hip fracture, AFib RVR.  Heart rate is better controlled today.  She is for surgical repair later on today.  She is been evaluated by Dr. Hessie Dibble for Cardiology, who did feel she was acceptable risk.  Patient does have some increasing pain today, we will adjust pain medication.  Eliquis will be restarted after surgery once okay with Orthopedic surgeon.    Review of Systems:  General: No fever or chills. No weight changes, fatigue, weakness.   HEENT: No headaches, dizziness, changes in vision, changes in hearing, or difficulty swallowing.    Skin:  No rashes, erythema or bruises.   Cardiac: No chest pain, palpitations, or arrhythmia.    Respiratory: No shortness of breath, cough, or wheezing.  GI: No nausea or vomiting. No abdominal pain.   Urinary: No dysuria, hematuria, or change in frequency.    Vascular: No edema.     Musculoskeletal:  Persistent pain right hip   Neurologic: No loss of sensation, numbness or tingling.   Endocrine: No heat or cold intolerance or polydipsia.   Psychiatric: No insomnia, depression or anxiety.    Physical Examination  General: Patient is alert and oriented to person, place, and time. No acute distress. Communicates appropriately.   Head: Normocephalic and atraumatic.    Eyes: Pupils equally round and react to light and accommodate. Extraocular movements intact.  Conjunctiva normal. Sclerae are normal.    Nose: Nasal passages clear. Mucosa moist.    Throat: Moist oral mucosa. No erythema or exudate of the pharynx. Clear oropharynx.    Neck: Supple. No cervical lymphadenopathy or supraclavicular nodes detected. Trachea midline   Heart:  Irregularly irregular, no murmur heart rate control   Lungs: Clear to auscultation bilaterally with  no wheezes or rales. Equal chest excursion.  No conversational dyspnea. No respiratory distress noted.   Abdomen: Soft, nontender, nondistended belly. Bowel sounds are present in all four quadrants. No rigidity.  No guarding.  No ascites.   Extremities: No edema, cyanosis, or clubbing.    Skin: Warm and dry without lesions. No ecchymosis noted.    Neurologic: Cranial nerves II through XII are grossly intact. Sensation to light touch is intact. Strength 5/5 in upper extremities and lower extremities bilaterally.    Genitourinary:  No urinary incontinence or Foley catheter   Psychiatric: Judgment and insight are intact. Mood and affect are appropriate for the situation.      Vital Signs:  Temp (24hrs) Max:38.3 C (100.9 F)      Temperature: 36.7 C (98.1 F)  BP (Non-Invasive): (!) 184/101  MAP (Non-Invasive): 80 mmHG  Heart Rate: (!) 103  Respiratory Rate: 18  SpO2: 94 %    Current Medications:  alcohol 62 % (NOZIN NASAL SANITIZER) nasal solution, 1 Each, Each Nostril, 2x/day  docusate sodium (COLACE) capsule, 100 mg, Oral, 2x/day  [Held by provider] fentaNYL (DURAGESIC) patch 50 mcg/hr, 1 Patch, Transdermal, Q72H  guaiFENesin (MUCINEX) extended release tablet - for cough (expectorant), 600 mg, Oral, 2x/day  HYDROcodone-acetaminophen (NORCO) 10-325 mg per tablet, 1 Tablet, Oral, Q6H PRN  HYDROmorphone (DILAUDID) 2 mg/mL injection, 1.2 mg, Intravenous, Q3H PRN  levoFLOXacin (LEVAQUIN)  tablet 750 mg, 750 mg, Oral, Daily  LORazepam (ATIVAN) tablet, 0.5 mg, Oral, Q6H PRN  metoprolol succinate (TOPROL-XL) 24 hr extended release tablet, 12.5 mg, Oral, 2x/day  ondansetron (ZOFRAN) 2 mg/mL injection, 4 mg, Intravenous, Q8H PRN  polyethylene glycol (MIRALAX) oral packet, 17 g, Oral, Daily  potassium chloride 20 mEq in SW 100 mL premix infusion, 20 mEq, Intravenous, Q2H  valsartan (DIOVAN) tablet, 80 mg, Oral, 2x/day        Current Orders:  Active Orders   Imaging    FLUORO LOWER EXTREMITY IN OR RIGHT     Frequency: ONE TIME      Number of Occurrences: 1 Occurrences     Scheduling Instructions:               Lab    BASIC METABOLIC PANEL     Frequency: 0530 - AM DRAW     Number of Occurrences: 1 Occurrences    BASIC METABOLIC PANEL     Frequency: 0530 - AM DRAW     Number of Occurrences: 1 Occurrences    CBC/DIFF     Frequency: 0530 - AM DRAW     Number of Occurrences: 1 Occurrences    CBC/DIFF     Frequency: 0530 - AM DRAW     Number of Occurrences: 1 Occurrences    MAGNESIUM     Frequency: 0530 - AM DRAW     Number of Occurrences: 1 Occurrences    MAGNESIUM     Frequency: 0530 - AM DRAW     Number of Occurrences: 1 Occurrences   Diet    DIET NPO - SPECIFIC DATE & TIME     Frequency: ALL MEALS (NPO ONLY)     Number of Occurrences: 1 Occurrences   Nursing    ACTIVITY Activity: BEDREST; Instructions: FLAT     Frequency: UNTIL DISCONTINUED     Number of Occurrences: Until Specified    BLADDER SCAN PRN IF NO VOID     Frequency: PRN     Number of Occurrences: 1 Occurrences     Order Comments: If scanned urine volume greater than 300 ML, straight cath and call MD of urine cath volume. (enter secondary order for straight cath x1).  If scanned urine volume less than 300 ML rescan in 2 hours and notify MD of scanned volume.      CONTINUOUS CARDIAC MONITORING (ED USE ONLY)     Frequency: ONE TIME     Number of Occurrences: 1 Occurrences    FOLEY CATHETER INSERT & MAINTAIN     Frequency: UNTIL DISCONTINUED     Number of Occurrences: Until Specified    INCENTIVE SPIROMETRY NURSING     Frequency: Q1H WA     Number of Occurrences: 250 Occurrences    INDWELLING URINARY CATHETER CARE QSHIFT     Frequency: QSHIFT     Number of Occurrences: Until Specified    INTAKE AND OUTPUT Q8H     Frequency: Q8H     Number of Occurrences: Until Specified    MISCELLANEOUS MD/DO TO NURSE     Frequency: UNTIL DISCONTINUED     Number of Occurrences: Until Specified     Order Comments: Pls complete meds hx so Admission MedRec can be completed.  Thx.      Notify MD Vital  Signs     Frequency: PRN     Number of Occurrences: Until Specified    PT IS HIGH RISK FOR VENOUS THROMBOEMBOLISM  Frequency: CONTINUOUS     Number of Occurrences: Until Specified    PULSE OXIMETRY Q4H     Frequency: Q4H     Number of Occurrences: Until Specified    TELEMETRY MONITORING - Continuous     Frequency: CONTINUOUS     Number of Occurrences: Until Specified    VITAL SIGNS MULTI FREQUENCY     Frequency: UNTIL DISCONTINUED     Number of Occurrences: Until Specified    VITAL SIGNS Q4H     Frequency: Q4H     Number of Occurrences: Until Specified   Code Status    FULL CODE     Frequency: CONTINUOUS     Number of Occurrences: Until Specified   Consult    IP CONSULT TO CARDIOLOGY Requested Provider; Anderson Malta A     Frequency: ONE TIME     Number of Occurrences: 1 Occurrences     Order Comments: Afib RVR,  pre op cardiac risk  stratification      IP CONSULT TO ORTHOPEDICS Requested Provider; Mervyn Skeeters     Frequency: ONE TIME     Number of Occurrences: 1 Occurrences   Respiratory Care    INCENTIVE SPIROMETRY - RT INSTRUCT     Frequency: ONE TIME     Number of Occurrences: 1 Occurrences    OXYGEN - NASAL CANNULA     Frequency: PRN     Number of Occurrences: Until Specified     Order Comments: Flowrate should not exeed 6L/min except [facility] facility.           PULSE OXIMETRY - CONTINUOUS     Frequency: CONTINUOUS     Number of Occurrences: Until Specified   Medications    alcohol 62 % (NOZIN NASAL SANITIZER) nasal solution     Frequency: 2x/day     Dose: 1 Each     Route: Each Nostril    docusate sodium (COLACE) capsule     Frequency: 2x/day     Dose: 100 mg     Route: Oral    fentaNYL (DURAGESIC) patch 50 mcg/hr     Frequency: Q72H     Dose: 1 Patch     Route: Transdermal    guaiFENesin (MUCINEX) extended release tablet - for cough (expectorant)     Frequency: 2x/day     Dose: 600 mg     Route: Oral    HYDROcodone-acetaminophen (NORCO) 10-325 mg per tablet     Frequency: Q6H PRN     Dose: 1 Tablet     Route:  Oral    HYDROmorphone (DILAUDID) 2 mg/mL injection     Frequency: Q3H PRN     Dose: 1.2 mg     Route: Intravenous    levoFLOXacin (LEVAQUIN) tablet 750 mg     Frequency: Daily     Dose: 750 mg     Route: Oral    LORazepam (ATIVAN) tablet     Frequency: Q6H PRN     Dose: 0.5 mg     Route: Oral    metoprolol succinate (TOPROL-XL) 24 hr extended release tablet     Frequency: 2x/day     Dose: 12.5 mg     Route: Oral    ondansetron (ZOFRAN) 2 mg/mL injection     Frequency: Q8H PRN     Dose: 4 mg     Route: Intravenous    polyethylene glycol (MIRALAX) oral packet     Frequency: Daily     Dose: 17 g  Route: Oral    potassium chloride 20 mEq in SW 100 mL premix infusion     Frequency: Q2H     Dose: 20 mEq     Route: Intravenous    valsartan (DIOVAN) tablet     Frequency: 2x/day     Dose: 80 mg     Route: Oral          I/O:  I/O last 24 hours:      Intake/Output Summary (Last 24 hours) at 02/27/2022 1244  Last data filed at 02/27/2022 1029  Gross per 24 hour   Intake 240 ml   Output --   Net 240 ml     I/O current shift:  06/13 0700 - 06/13 1859  In: 40   Out: -       Labs (Please indicate ordered or reviewed)  Reviewed: Lab Results Today:   Results for orders placed or performed during the hospital encounter of 02/26/22 (from the past 24 hour(s))   ECG 12 LEAD   Result Value Ref Range    Ventricular rate 86 BPM    QRS Duration 114 ms    QT Interval 400 ms    QTC Calculation 478 ms    Calculated R Axis 96 degrees    Calculated T Axis -39 degrees   COMPREHENSIVE METABOLIC PANEL, NON-FASTING   Result Value Ref Range    SODIUM 142 136 - 145 mmol/L    POTASSIUM 3.1 (L) 3.5 - 5.1 mmol/L    CHLORIDE 106 98 - 107 mmol/L    CO2 TOTAL 24 21 - 31 mmol/L    ANION GAP 12 10 - 20 mmol/L    BUN 36 (H) 7 - 25 mg/dL    CREATININE 1.21 0.60 - 1.30 mg/dL    BUN/CREA RATIO 30 (H) 6 - 22    ESTIMATED GFR 46 (L) >59 mL/min/1.24m2    ALBUMIN 3.8 3.5 - 5.7 g/dL    CALCIUM 9.5 8.6 - 10.3 mg/dL    GLUCOSE 130 (H) 74 - 109 mg/dL    ALKALINE  PHOSPHATASE 63 34 - 104 U/L    ALT (SGPT) 16 7 - 52 U/L    AST (SGOT) 22 13 - 39 U/L    BILIRUBIN TOTAL 0.6 0.3 - 1.2 mg/dL    PROTEIN TOTAL 7.1 6.4 - 8.9 g/dL    ALBUMIN/GLOBULIN RATIO 1.2 0.8 - 1.4    OSMOLALITY, CALCULATED 293 (H) 270 - 290 mOsm/kg    CALCIUM, CORRECTED 9.7 8.9 - 10.8 mg/dL    GLOBULIN 3.3 2.9 - 5.4   PT/INR   Result Value Ref Range    PROTHROMBIN TIME 15.7 (H) 9.8 - 12.7 seconds    INR 1.36 <=5.00   PTT (PARTIAL THROMBOPLASTIN TIME)   Result Value Ref Range    APTT 26.6 26.0 - 36.0 seconds   MAGNESIUM   Result Value Ref Range    MAGNESIUM 2.0 1.9 - 2.7 mg/dL   CBC WITH DIFF   Result Value Ref Range    WBCS UNCORRECTED 5.4 x10^3/uL    WBC 5.4 4.0 - 10.5 x10^3/uL    RBC 3.82 (L) 4.20 - 5.40 x10^6/uL    HGB 11.8 (L) 12.5 - 16.0 g/dL    HCT 33.9 (L) 37.0 - 47.0 %    MCV 88.9 78.0 - 99.0 fL    MCH 31.0 27.0 - 32.0 pg    MCHC 34.9 32.0 - 36.0 g/dL    RDW 15.9 (H) 11.6 - 14.8 %  PLATELETS 183 140 - 440 x10^3/uL    MPV 10.0 7.4 - 10.4 fL   MANUAL DIFFERENTIAL   Result Value Ref Range    WBC 5.4 x10^3/uL    NEUTROPHIL % 62 40 - 76 %    LYMPHOCYTE % 21 (L) 25 - 45 %    MONOCYTE % 12 0 - 12 %    EOSINOPHIL % 1 0 - 7 %    BASOPHIL % 1 0 - 3 %    METAMYELOCYTE %      MYELOCYTE %      PROMYELOCYTE %      BAND % 3 (L) 5 - 11 %    BLAST %      OTHER %      NEUTROPHIL ABSOLUTE 3.51 1.80 - 8.40 x10^3/uL    LYMPHOCYTE ABSOLUTE 1.13 1.10 - 5.00 x10^3/uL    MONOCYTE ABSOLUTE 0.65 0.00 - 1.30 x10^3/uL    EOSINOPHIL ABSOLUTE 0.05 0.00 - 0.80 x10^3/uL    BASOPHIL ABSOLUTE 0.05 0.00 - 0.30 x10^3/uL    METAMYELOCYTE ABSOLUTE      MYELOCYTE ABSOLUTE      PROMYELOCYTE ABSOLUTE      BLAST ABSOLUTE      OTHER CELL ABSOLUTE      ANISOCYTOSIS 1+     POLYCHROMASIA      POIKILOCYTOSIS Slight     BASOPHILIC STIPPLING      MICROCYTOSIS      MACROCYTOSIS      ROULEAUX      SCHISTOCYTES      SPHEROCYTES      TARGET CELLS      TEARDROP CELLS      OVALOCYTE (ELLIPTOCYTE)      CRENATED RED CELLS      STOMATOCYTES      ACANTHOCYTES  (SPUR CELL)      ECHINOCYTE (BURR CELL)      BLISTER CELLS      RBC AGGLUTINATES      HOWELL JOLLY BODIES      ATYPICAL LYMPHOCYTES      TOXIC GRANULATION      DOHLE BODIES      TOXIC VACUOLIZATION      AUER RODS      BASKET CELLS      HYPERSEGMENTATION      LARGE PLATELETS      PLATELET CLUMPS      WBC MORPHOLOGY COMMENT      RBC MORPHOLOGY COMMENT      PLATELET MORPHOLOGY COMMENT Adequate     BANDS NEUTROPHILS MANUAL 3     BAND ABSOLUTE      NEUTROPHILS MANUAL 62     LYMPHOCYTES MANUAL 21     MONOCYTES MANUAL 12     EOSINOPHILS MANUAL 1     BASOPHILS MANUAL 1     PROMYELOCYTES MANUAL      MYELOCYTES MANUAL      METAMYELOCYTES MANUAL      BLASTS MANUAL      TOTAL CELLS COUNTED [#] IN BLOOD 100     OTHER CELLS MANUAL      NUCLEATED RBC MANUAL      PLASMA CELL %      PLASMA CELL ABSOLUE      PLASMA CELLS MANUAL      HYPOCHROMASIA     PHOSPHORUS   Result Value Ref Range    PHOSPHORUS 2.8 (L) 3.7 - 7.2 mg/dL   TROPONIN-I   Result Value Ref Range    TROPONIN I 29 (H) <15  ng/L   THYROID STIMULATING HORMONE (SENSITIVE TSH)   Result Value Ref Range    TSH 4.662 0.450 - 5.330 uIU/mL   TYPE AND CROSS RED CELLS - UNITS , 2 Units   Result Value Ref Range    UNITS ORDERED 2     SPECIMEN EXPIRATION DATE 03/01/2022,2359     ABO/RH(D) A POSITIVE     ANTIBODY SCREEN NEGATIVE    BPAM PACKED CELL ORDER   Result Value Ref Range    Coding System [id number]     UNIT NUMBER [phone]     BLOOD COMPONENT TYPE LR RBC, Adsol1, 04710     UNIT DIVISION 00     UNIT DISPENSE STATUS ALLOCATED     TRANSFUSION STATUS OK TO TRANSFUSE     IS CROSSMATCH COMPATIBLE     Product Code H6073X10     Coding System [id number]     UNIT NUMBER [id number]     BLOOD COMPONENT TYPE LR RBC, Adsol1, 04710     UNIT DIVISION 00     UNIT DISPENSE STATUS ALLOCATED     TRANSFUSION STATUS OK TO TRANSFUSE     IS CROSSMATCH COMPATIBLE     Product Code J0093G18    TROPONIN-I   Result Value Ref Range    TROPONIN I 24 (H) <15 ng/L   PTT (PARTIAL THROMBOPLASTIN TIME)   Result  Value Ref Range    APTT 32.5 26.0 - 36.0 seconds   CBC - AM ONCE   Result Value Ref Range    WBCS UNCORRECTED 5.1 x10^3/uL    WBC 5.1 4.0 - 10.5 x10^3/uL    RBC 3.61 (L) 4.20 - 5.40 x10^6/uL    HGB 11.2 (L) 12.5 - 16.0 g/dL    HCT 32.2 (L) 37.0 - 47.0 %    MCV 89.2 78.0 - 99.0 fL    MCH 31.0 27.0 - 32.0 pg    MCHC 34.7 32.0 - 36.0 g/dL    RDW 15.8 (H) 11.6 - 14.8 %    PLATELETS 179 140 - 440 x10^3/uL    MPV 10.0 7.4 - 10.4 fL   BASIC METABOLIC PANEL - AM ONCE   Result Value Ref Range    SODIUM 139 136 - 145 mmol/L    POTASSIUM 3.0 (L) 3.5 - 5.1 mmol/L    CHLORIDE 103 98 - 107 mmol/L    CO2 TOTAL 27 21 - 31 mmol/L    ANION GAP 9 (L) 10 - 20 mmol/L    CALCIUM 8.7 8.6 - 10.3 mg/dL    GLUCOSE 132 (H) 74 - 109 mg/dL    BUN 24 7 - 25 mg/dL    CREATININE 0.96 0.60 - 1.30 mg/dL    BUN/CREA RATIO 25 (H) 6 - 22    ESTIMATED GFR 61 >59 mL/min/1.53m2    OSMOLALITY, CALCULATED 284 270 - 290 mOsm/kg   MAGNESIUM - ONCE   Result Value Ref Range    MAGNESIUM 1.7 (L) 1.9 - 2.7 mg/dL   PTT (PARTIAL THROMBOPLASTIN TIME)   Result Value Ref Range    APTT 48.2 (H) 26.0 - 36.0 seconds       Radiology Tests (Please indicate ordered or reviewed)  Reviewed: No results found.    Problem List:  Active Hospital Problems   (*Primary Problem)    Diagnosis   . *Hip fracture (CMS HCC)   . A-fib (CMS HCC)   . Multiple myeloma (CMS HCC)         Assessment/ Plan:  The patient  will be for surgical repair of right hip later today.  She is currently NPO.  We will increase her Dilaudid dose as well as Lortab for now, decrease once her pain is better controlled.  She will be continued on rate controlling medications, and Eliquis will be restarted once okay with Orthopedic surgery.  Further interventions will be based on clinical course.  Hospitalist has examined the patient, as reviewed all material, agrees with the above medical management at this time.    DVT/PE Prophylaxis:  Eliquis    Annamarie Dawley, PA-C

## 2022-02-27 NOTE — Care Plan (Signed)
Problem: Adult Inpatient Plan of Care  Goal: Plan of Care Review  Outcome: Ongoing (see interventions/notes)  Goal: Patient-Specific Goal (Individualized)  Outcome: Ongoing (see interventions/notes)  Goal: Absence of Hospital-Acquired Illness or Injury  Outcome: Ongoing (see interventions/notes)  Goal: Optimal Comfort and Wellbeing  Outcome: Ongoing (see interventions/notes)  Goal: Rounds/Family Conference  Outcome: Ongoing (see interventions/notes)     Problem: Orthopaedic Fracture  Goal: Absence of Bleeding  Outcome: Ongoing (see interventions/notes)  Goal: Effective Bowel Elimination  Outcome: Ongoing (see interventions/notes)  Goal: Absence of Embolism Signs and Symptoms  Outcome: Ongoing (see interventions/notes)  Goal: Fracture Stability  Outcome: Ongoing (see interventions/notes)  Goal: Optimal Functional Ability  Outcome: Ongoing (see interventions/notes)  Goal: Absence of Infection Signs and Symptoms  Outcome: Ongoing (see interventions/notes)  Goal: Effective Tissue Perfusion  Outcome: Ongoing (see interventions/notes)  Goal: Optimal Pain Control and Function  Outcome: Ongoing (see interventions/notes)  Goal: Effective Oxygenation and Ventilation  Outcome: Ongoing (see interventions/notes)     New admission 02/26/22 with hip fracture--planning for possible OR 02/27/22.  Medication as needed for pain.

## 2022-02-27 NOTE — Nurses Notes (Signed)
Home medication quantity of 2 tablets Revlimid returned to family.

## 2022-02-27 NOTE — Consults (Signed)
[name]    Date of Service:  02/27/2022  Katelyn Craig   81 y.o. female  Date of Admission:  02/26/2022  Date of Birth:  03-24-1941  1     Reason for Consultation:  Atrial fibrillation and fracture of the right hip    Problem List:  Active Hospital Problems   (*Primary Problem)    Diagnosis   . *Hip fracture (CMS HCC)       History of Present Illness:  Katelyn Craig is a 81 y.o. White female who presents with history of fall and fracture of the right hip.  She has history of atrial fibrillation in the past which is rate controlled and she is on anticoagulation.  Denies any chest Craig or dyspnea.  She has history of hypertension and multiple myeloma.  Has had back surgery.      History:    Past Medical:    Past Medical History:   Diagnosis Date   . Cancer (CMS [place])    . HTN (hypertension)    . Multiple myeloma (CMS HCC)       Past Surgical:    Past Surgical History:   Procedure Laterality Date   . HX BACK SURGERY        Family:    Family Medical History:    None        Social:   reports that she has never smoked. She has never been exposed to tobacco smoke. She has never used smokeless tobacco. She reports that she does not drink alcohol and does not use drugs.     REVIEW OF SYSTEMS:  All systems have been reviewed and found to be negative except for as stated above in the history of present illness.       Constitutional - no appetite or weight changes, no fatigue, no fevers, chills, or night sweats  Respiratory - no dyspnea or cough  Cardiovascular - no chest Craig or palpitations  Gastrointestinal - no nausea, vomiting, diarrhea, or constipation, no dyspepsia  Skin - no rashes, color changes, or lesions  Musculoskeletal -Craig in the right hip with fracture after a fall  Genitourinary - no urinary frequency or dysuria, no genital discharge  Neurologic - no vision or hearing changes, no weakness, no parasthesias   Psychiatric - mood has been appropriate      Allergies   Allergen  Reactions   . Oxycodone Swelling     Throat swelling; takes hydrocodone at home       Medications:  Medications Prior to Admission     Prescriptions    fentaNYL (DURAGESIC) 50 mcg/hr Transdermal Patch 72 hr    Place 1 Patch on the skin Every 72 hours    HYDROcodone-acetaminophen (NORCO) 7.5-325 mg Oral Tablet    Take 1 Tablet by mouth Every 6 hours as needed for Craig    lenalidomide (REVLIMID) 10 mg Oral Capsule    Take 1 Capsule (10 mg) by mouth Every 21 days Take daily for 21 days be off for 7 days then restart    levoFLOXacin (LEVAQUIN) 750 mg Oral Tablet    Take 1 Tablet (750 mg total) by mouth Once a day for 7 days    Patient not taking:  Reported on 02/26/2022    potassium chloride (K-DUR) 10 mEq Oral Tab Sust.Rel. Particle/Crystal    Take 1 Tablet (10 mEq total) by mouth Once a day    Telmisartan-Hydrochlorothiazid 80-12.5 mg Oral Tablet  Take 1 Tablet by mouth Once a day        acetaminophen (TYLENOL) tablet, 650 mg, Oral, Q4H PRN  alcohol 62 % (NOZIN NASAL SANITIZER) nasal solution, 1 Each, Each Nostril, 2x/day  docusate sodium (COLACE) capsule, 100 mg, Oral, 2x/day  [Held by provider] fentaNYL (DURAGESIC) patch 50 mcg/hr, 1 Patch, Transdermal, Q72H  guaiFENesin (MUCINEX) extended release tablet - for cough (expectorant), 600 mg, Oral, 2x/day  [Held by provider] hydroCHLOROthiazide (HYDRODIURIL) tablet, 12.5 mg, Oral, Daily  HYDROcodone-acetaminophen (NORCO) 7.5 mg-325 mg per tablet, 1 Tablet, Oral, Q6H PRN  HYDROmorphone (DILAUDID) 2 mg/mL injection, 1 mg, Intravenous, Q3H PRN  levoFLOXacin (LEVAQUIN) tablet 750 mg, 750 mg, Oral, Daily  LORazepam (ATIVAN) tablet, 0.5 mg, Oral, Q6H PRN  metoprolol tartrate (LOPRESSOR) tablet, 6.25 mg, Oral, 2x/day  ondansetron (ZOFRAN) 2 mg/mL injection, 4 mg, Intravenous, Q8H PRN  polyethylene glycol (MIRALAX) oral packet, 17 g, Oral, Daily  potassium chloride 20 mEq in SW 100 mL premix infusion, 20 mEq, Intravenous, Q2H  [Held by provider] valsartan (DIOVAN) tablet, 160  mg, Oral, Daily          Physical Exam:  General:  Not in any acute distress she was lying in bed.  She does have Craig in the right hip  Cardiac:  Irregular rate and rhythm, no murmur auscultated.  Respiratory: Clear to auscultation bilaterally without wheeze.  Abdomen: Positive bowel sounds, soft, nontender  Extremities: No peripheral edema noted on exam.    Vitals:  Temperature: 36.4 C (97.6 F)  Heart Rate: (!) 170  Respiratory Rate: 18  BP (Non-Invasive): (!) 154/112  SpO2: 90 %    Nursing note and vitals reviewed.     Labs:     Results for orders placed or performed during the hospital encounter of 02/26/22 (from the past 24 hour(s))   ECG 12 LEAD   Result Value Ref Range    Ventricular rate 86 BPM    QRS Duration 114 ms    QT Interval 400 ms    QTC Calculation 478 ms    Calculated R Axis 96 degrees    Calculated T Axis -39 degrees   COMPREHENSIVE METABOLIC PANEL, NON-FASTING   Result Value Ref Range    SODIUM 142 136 - 145 mmol/L    POTASSIUM 3.1 (L) 3.5 - 5.1 mmol/L    CHLORIDE 106 98 - 107 mmol/L    CO2 TOTAL 24 21 - 31 mmol/L    ANION GAP 12 10 - 20 mmol/L    BUN 36 (H) 7 - 25 mg/dL    CREATININE 1.21 0.60 - 1.30 mg/dL    BUN/CREA RATIO 30 (H) 6 - 22    ESTIMATED GFR 46 (L) >59 mL/min/1.36m2    ALBUMIN 3.8 3.5 - 5.7 g/dL    CALCIUM 9.5 8.6 - 10.3 mg/dL    GLUCOSE 130 (H) 74 - 109 mg/dL    ALKALINE PHOSPHATASE 63 34 - 104 U/L    ALT (SGPT) 16 7 - 52 U/L    AST (SGOT) 22 13 - 39 U/L    BILIRUBIN TOTAL 0.6 0.3 - 1.2 mg/dL    PROTEIN TOTAL 7.1 6.4 - 8.9 g/dL    ALBUMIN/GLOBULIN RATIO 1.2 0.8 - 1.4    OSMOLALITY, CALCULATED 293 (H) 270 - 290 mOsm/kg    CALCIUM, CORRECTED 9.7 8.9 - 10.8 mg/dL    GLOBULIN 3.3 2.9 - 5.4   PT/INR   Result Value Ref Range    PROTHROMBIN TIME 15.7 (H) 9.8 - 12.7  seconds    INR 1.36 <=5.00   PTT (PARTIAL THROMBOPLASTIN TIME)   Result Value Ref Range    APTT 26.6 26.0 - 36.0 seconds   MAGNESIUM   Result Value Ref Range    MAGNESIUM 2.0 1.9 - 2.7 mg/dL   CBC WITH DIFF   Result Value  Ref Range    WBCS UNCORRECTED 5.4 x10^3/uL    WBC 5.4 4.0 - 10.5 x10^3/uL    RBC 3.82 (L) 4.20 - 5.40 x10^6/uL    HGB 11.8 (L) 12.5 - 16.0 g/dL    HCT 33.9 (L) 37.0 - 47.0 %    MCV 88.9 78.0 - 99.0 fL    MCH 31.0 27.0 - 32.0 pg    MCHC 34.9 32.0 - 36.0 g/dL    RDW 15.9 (H) 11.6 - 14.8 %    PLATELETS 183 140 - 440 x10^3/uL    MPV 10.0 7.4 - 10.4 fL   MANUAL DIFFERENTIAL   Result Value Ref Range    WBC 5.4 x10^3/uL    NEUTROPHIL % 62 40 - 76 %    LYMPHOCYTE % 21 (L) 25 - 45 %    MONOCYTE % 12 0 - 12 %    EOSINOPHIL % 1 0 - 7 %    BASOPHIL % 1 0 - 3 %    METAMYELOCYTE %      MYELOCYTE %      PROMYELOCYTE %      BAND % 3 (L) 5 - 11 %    BLAST %      OTHER %      NEUTROPHIL ABSOLUTE 3.51 1.80 - 8.40 x10^3/uL    LYMPHOCYTE ABSOLUTE 1.13 1.10 - 5.00 x10^3/uL    MONOCYTE ABSOLUTE 0.65 0.00 - 1.30 x10^3/uL    EOSINOPHIL ABSOLUTE 0.05 0.00 - 0.80 x10^3/uL    BASOPHIL ABSOLUTE 0.05 0.00 - 0.30 x10^3/uL    METAMYELOCYTE ABSOLUTE      MYELOCYTE ABSOLUTE      PROMYELOCYTE ABSOLUTE      BLAST ABSOLUTE      OTHER CELL ABSOLUTE      ANISOCYTOSIS 1+     POLYCHROMASIA      POIKILOCYTOSIS Slight     BASOPHILIC STIPPLING      MICROCYTOSIS      MACROCYTOSIS      ROULEAUX      SCHISTOCYTES      SPHEROCYTES      TARGET CELLS      TEARDROP CELLS      OVALOCYTE (ELLIPTOCYTE)      CRENATED RED CELLS      STOMATOCYTES      ACANTHOCYTES (SPUR CELL)      ECHINOCYTE (BURR CELL)      BLISTER CELLS      RBC AGGLUTINATES      HOWELL JOLLY BODIES      ATYPICAL LYMPHOCYTES      TOXIC GRANULATION      DOHLE BODIES      TOXIC VACUOLIZATION      AUER RODS      BASKET CELLS      HYPERSEGMENTATION      LARGE PLATELETS      PLATELET CLUMPS      WBC MORPHOLOGY COMMENT      RBC MORPHOLOGY COMMENT      PLATELET MORPHOLOGY COMMENT Adequate     BANDS NEUTROPHILS MANUAL 3     BAND ABSOLUTE      NEUTROPHILS MANUAL 62     LYMPHOCYTES MANUAL 21  MONOCYTES MANUAL 12     EOSINOPHILS MANUAL 1     BASOPHILS MANUAL 1     PROMYELOCYTES MANUAL      MYELOCYTES MANUAL       METAMYELOCYTES MANUAL      BLASTS MANUAL      TOTAL CELLS COUNTED [#] IN BLOOD 100     OTHER CELLS MANUAL      NUCLEATED RBC MANUAL      PLASMA CELL %      PLASMA CELL ABSOLUE      PLASMA CELLS MANUAL      HYPOCHROMASIA     PHOSPHORUS   Result Value Ref Range    PHOSPHORUS 2.8 (L) 3.7 - 7.2 mg/dL   TROPONIN-I   Result Value Ref Range    TROPONIN I 29 (H) <15 ng/L   THYROID STIMULATING HORMONE (SENSITIVE TSH)   Result Value Ref Range    TSH 4.662 0.450 - 5.330 uIU/mL   TYPE AND CROSS RED CELLS - UNITS , 2 Units   Result Value Ref Range    UNITS ORDERED 2     SPECIMEN EXPIRATION DATE 03/01/2022,2359     ABO/RH(D) A POSITIVE     ANTIBODY SCREEN NEGATIVE    BPAM PACKED CELL ORDER   Result Value Ref Range    Coding System [id number]     UNIT NUMBER [id number]     BLOOD COMPONENT TYPE LR RBC, Adsol1, 04710     UNIT DIVISION 00     UNIT DISPENSE STATUS ALLOCATED     TRANSFUSION STATUS OK TO TRANSFUSE     IS CROSSMATCH COMPATIBLE     Product Code C1448J85     Coding System [id number]     UNIT NUMBER [id number]     BLOOD COMPONENT TYPE LR RBC, Adsol1, 04710     UNIT DIVISION 00     UNIT DISPENSE STATUS ALLOCATED     TRANSFUSION STATUS OK TO TRANSFUSE     IS CROSSMATCH COMPATIBLE     Product Code [id number]    TROPONIN-I   Result Value Ref Range    TROPONIN I 24 (H) <15 ng/L   PTT (PARTIAL THROMBOPLASTIN TIME)   Result Value Ref Range    APTT 32.5 26.0 - 36.0 seconds   CBC - AM ONCE   Result Value Ref Range    WBCS UNCORRECTED 5.1 x10^3/uL    WBC 5.1 4.0 - 10.5 x10^3/uL    RBC 3.61 (L) 4.20 - 5.40 x10^6/uL    HGB 11.2 (L) 12.5 - 16.0 g/dL    HCT 32.2 (L) 37.0 - 47.0 %    MCV 89.2 78.0 - 99.0 fL    MCH 31.0 27.0 - 32.0 pg    MCHC 34.7 32.0 - 36.0 g/dL    RDW 15.8 (H) 11.6 - 14.8 %    PLATELETS 179 140 - 440 x10^3/uL    MPV 10.0 7.4 - 10.4 fL   BASIC METABOLIC PANEL - AM ONCE   Result Value Ref Range    SODIUM 139 136 - 145 mmol/L    POTASSIUM 3.0 (L) 3.5 - 5.1 mmol/L    CHLORIDE 103 98 - 107 mmol/L    CO2 TOTAL 27 21 - 31  mmol/L    ANION GAP 9 (L) 10 - 20 mmol/L    CALCIUM 8.7 8.6 - 10.3 mg/dL    GLUCOSE 132 (H) 74 - 109 mg/dL    BUN 24 7 - 25 mg/dL    CREATININE 0.96 0.60 - 1.30  mg/dL    BUN/CREA RATIO 25 (H) 6 - 22    ESTIMATED GFR 61 >59 mL/min/1.67m2    OSMOLALITY, CALCULATED 284 270 - 290 mOsm/kg   MAGNESIUM - ONCE   Result Value Ref Range    MAGNESIUM 1.7 (L) 1.9 - 2.7 mg/dL   PTT (PARTIAL THROMBOPLASTIN TIME)   Result Value Ref Range    APTT 48.2 (H) 26.0 - 36.0 seconds       Diagnostic Tests   Reviewed:     Most Recent EKG This Encounter   ECG 12 LEAD    Collection Time: 02/26/22  1:19 PM   Result Value    Ventricular rate 86    QRS Duration 114    QT Interval 400    QTC Calculation 478    Calculated R Axis 96    Calculated T Axis -39    Narrative    Atrial fibrillation  Rightward axis  ST & T wave abnormality, consider inferior ischemia  Abnormal ECG  No previous ECGs available  Confirmed by QAnderson Malta([id number]) on 02/26/2022 5:43:23 PM     No results found for this or any previous visit.   @LASTECHORESULTS @   @LASTECHO @     Active Hospital Problems    Diagnosis   . Primary Problem: Hip fracture (CMS HCC)        Assessment:   1. Atrial fibrillation with history persistent atrial fibrillation.      2. Fracture of the right hip    3. Hypertension    4. History of multiple myeloma     5. 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  We will keep her on an ACE inhibitor and make the beta-blocker to be metoprolol succinate.    Her cardiac status is stable at this time for her possible surgery on the right hip.      Thank you very much for asking me to take part in the management.        NHewitt Shorts MD       This note was partially generated using MModal Fluency  Direct system, and there may be some incorrect words, spellings, and punctuation that were not noted in checking the note before saving.

## 2022-02-28 ENCOUNTER — Inpatient Hospital Stay (HOSPITAL_COMMUNITY): Payer: Medicare Other | Admitting: Anesthesiology

## 2022-02-28 ENCOUNTER — Encounter (HOSPITAL_COMMUNITY)
Admission: EM | Disposition: A | Discharge status: Rehabilitation Facility | Payer: Self-pay | Source: Home / Self Care | Attending: Family Medicine

## 2022-02-28 ENCOUNTER — Inpatient Hospital Stay (HOSPITAL_COMMUNITY): Payer: Medicare Other

## 2022-02-28 DIAGNOSIS — S72141A Displaced intertrochanteric fracture of right femur, initial encounter for closed fracture: Principal | ICD-10-CM

## 2022-02-28 LAB — CBC WITH DIFF
BASOPHIL #: 0 10*3/uL (ref 0.00–0.30)
BASOPHIL %: 0 % (ref 0–3)
EOSINOPHIL #: 0 10*3/uL (ref 0.00–0.80)
EOSINOPHIL %: 0 % (ref 0–7)
HCT: 30.1 % — ABNORMAL LOW (ref 37.0–47.0)
HGB: 10.7 g/dL — ABNORMAL LOW (ref 12.5–16.0)
LYMPHOCYTE #: 0.8 10*3/uL — ABNORMAL LOW (ref 1.10–5.00)
LYMPHOCYTE %: 14 % — ABNORMAL LOW (ref 25–45)
MCH: 31.2 pg (ref 27.0–32.0)
MCHC: 35.4 g/dL (ref 32.0–36.0)
MCV: 88.1 fL (ref 78.0–99.0)
MONOCYTE #: 1.1 10*3/uL (ref 0.00–1.30)
MONOCYTE %: 20 % — ABNORMAL HIGH (ref 0–12)
MPV: 9.6 fL (ref 7.4–10.4)
NEUTROPHIL #: 3.7 10*3/uL (ref 1.80–8.40)
NEUTROPHIL %: 66 % (ref 40–76)
PLATELETS: 155 10*3/uL (ref 140–440)
RBC: 3.41 10*6/uL — ABNORMAL LOW (ref 4.20–5.40)
RDW: 15.2 % — ABNORMAL HIGH (ref 11.6–14.8)
WBC: 5.6 10*3/uL (ref 4.0–10.5)
WBCS UNCORRECTED: 5.6 10*3/uL

## 2022-02-28 LAB — PT/INR
INR: 1.5 (ref <=5.00)
PROTHROMBIN TIME: 17.3 seconds — ABNORMAL HIGH (ref 9.8–12.7)

## 2022-02-28 LAB — MAGNESIUM: MAGNESIUM: 1.8 mg/dL — ABNORMAL LOW (ref 1.9–2.7)

## 2022-02-28 LAB — BASIC METABOLIC PANEL
ANION GAP: 8 mmol/L — ABNORMAL LOW (ref 10–20)
BUN/CREA RATIO: 26 — ABNORMAL HIGH (ref 6–22)
BUN: 17 mg/dL (ref 7–25)
CALCIUM: 7.9 mg/dL — ABNORMAL LOW (ref 8.6–10.3)
CHLORIDE: 103 mmol/L (ref 98–107)
CO2 TOTAL: 27 mmol/L (ref 21–31)
CREATININE: 0.65 mg/dL (ref 0.60–1.30)
ESTIMATED GFR: 90 mL/min/{1.73_m2} (ref >59)
GLUCOSE: 121 mg/dL — ABNORMAL HIGH (ref 74–109)
OSMOLALITY, CALCULATED: 279 mOsm/kg (ref 270–290)
POTASSIUM: 3 mmol/L — ABNORMAL LOW (ref 3.5–5.1)
SODIUM: 138 mmol/L (ref 136–145)

## 2022-02-28 SURGERY — INSERTION NAIL GAMMA FEMUR
Anesthesia: General | Site: Hip | Laterality: Right | Wound class: Clean Wound: Uninfected operative wounds in which no inflammation occurred

## 2022-02-28 MED ORDER — METOPROLOL TARTRATE 5 MG/5 ML INTRAVENOUS SOLUTION
INTRAVENOUS | Status: AC
Start: 2022-02-28 — End: 2022-02-28
  Filled 2022-02-28: qty 5

## 2022-02-28 MED ORDER — FENTANYL (PF) 50 MCG/ML INJECTION SOLUTION
INTRAMUSCULAR | Status: AC
Start: 2022-02-28 — End: 2022-02-28
  Filled 2022-02-28: qty 2

## 2022-02-28 MED ORDER — IPRATROPIUM 0.5 MG-ALBUTEROL 3 MG (2.5 MG BASE)/3 ML NEBULIZATION SOLN
3.0000 mL | INHALATION_SOLUTION | Freq: Once | RESPIRATORY_TRACT | Status: DC | PRN
Start: 2022-02-28 — End: 2022-02-28

## 2022-02-28 MED ORDER — CEFAZOLIN 1 GRAM SOLUTION FOR INJECTION
INTRAMUSCULAR | Status: AC
Start: 2022-02-28 — End: 2022-02-28
  Filled 2022-02-28: qty 20

## 2022-02-28 MED ORDER — ONDANSETRON HCL (PF) 4 MG/2 ML INJECTION SOLUTION
INTRAMUSCULAR | Status: AC
Start: 2022-02-28 — End: 2022-02-28
  Filled 2022-02-28: qty 2

## 2022-02-28 MED ORDER — SODIUM CHLORIDE 0.9 % (FLUSH) INJECTION SYRINGE
3.0000 mL | INJECTION | Freq: Three times a day (TID) | INTRAMUSCULAR | Status: DC
Start: 2022-02-28 — End: 2022-03-03
  Administered 2022-02-28: 0 mL
  Administered 2022-02-28 – 2022-03-01 (×2): 3 mL
  Administered 2022-03-01: 0 mL
  Administered 2022-03-01: 3 mL
  Administered 2022-03-02: 0 mL
  Administered 2022-03-02 (×2): 3 mL
  Administered 2022-03-03 (×2): 0 mL

## 2022-02-28 MED ORDER — ONDANSETRON HCL (PF) 4 MG/2 ML INJECTION SOLUTION
4.0000 mg | Freq: Once | INTRAMUSCULAR | Status: AC
Start: 2022-02-28 — End: 2022-02-28
  Administered 2022-02-28: 4 mg via INTRAVENOUS

## 2022-02-28 MED ORDER — FENTANYL (PF) 50 MCG/ML INJECTION WRAPPER
50.0000 ug | INJECTION | INTRAMUSCULAR | Status: DC | PRN
Start: 2022-02-28 — End: 2022-02-28
  Administered 2022-02-28 (×2): 50 ug via INTRAVENOUS

## 2022-02-28 MED ORDER — POTASSIUM CHLORIDE ER 20 MEQ TABLET,EXTENDED RELEASE(PART/CRYST)
40.0000 meq | ORAL_TABLET | Freq: Three times a day (TID) | ORAL | Status: AC
Start: 2022-02-28 — End: 2022-03-01
  Administered 2022-02-28 (×2): 40 meq via ORAL
  Administered 2022-02-28: 0 meq via ORAL
  Filled 2022-02-28 (×2): qty 2

## 2022-02-28 MED ORDER — FAMOTIDINE (PF) 20 MG/2 ML INTRAVENOUS SOLUTION
INTRAVENOUS | Status: AC
Start: 2022-02-28 — End: 2022-02-28
  Filled 2022-02-28: qty 2

## 2022-02-28 MED ORDER — FENTANYL (PF) 50 MCG/ML INJECTION WRAPPER
25.0000 ug | INJECTION | INTRAMUSCULAR | Status: DC | PRN
Start: 2022-02-28 — End: 2022-02-28
  Administered 2022-02-28 (×2): 25 ug via INTRAVENOUS

## 2022-02-28 MED ORDER — SODIUM CHLORIDE 0.9 % (FLUSH) INJECTION SYRINGE
3.0000 mL | INJECTION | INTRAMUSCULAR | Status: DC | PRN
Start: 2022-02-28 — End: 2022-03-03

## 2022-02-28 MED ORDER — ROCURONIUM 10 MG/ML INTRAVENOUS SYRINGE WRAPPER
INJECTION | Freq: Once | INTRAVENOUS | Status: DC | PRN
Start: 2022-02-28 — End: 2022-02-28
  Administered 2022-02-28: 10 mg via INTRAVENOUS
  Administered 2022-02-28: 30 mg via INTRAVENOUS

## 2022-02-28 MED ORDER — LIDOCAINE (PF) 100 MG/5 ML (2 %) INTRAVENOUS SYRINGE
INJECTION | Freq: Once | INTRAVENOUS | Status: DC | PRN
Start: 2022-02-28 — End: 2022-02-28
  Administered 2022-02-28: 40 mg via INTRAVENOUS

## 2022-02-28 MED ORDER — SENNOSIDES 8.6 MG-DOCUSATE SODIUM 50 MG TABLET
1.0000 | ORAL_TABLET | Freq: Two times a day (BID) | ORAL | Status: DC | PRN
Start: 2022-02-28 — End: 2022-03-03
  Filled 2022-02-28: qty 1

## 2022-02-28 MED ORDER — ONDANSETRON HCL (PF) 4 MG/2 ML INJECTION SOLUTION
4.0000 mg | Freq: Once | INTRAMUSCULAR | Status: DC | PRN
Start: 2022-02-28 — End: 2022-02-28

## 2022-02-28 MED ORDER — FENTANYL (PF) 50 MCG/ML INJECTION WRAPPER
INJECTION | Freq: Once | INTRAMUSCULAR | Status: DC | PRN
Start: 2022-02-28 — End: 2022-02-28
  Administered 2022-02-28: 50 ug via INTRAVENOUS
  Administered 2022-02-28: 25 ug via INTRAVENOUS
  Administered 2022-02-28: 50 ug via INTRAVENOUS
  Administered 2022-02-28 (×5): 25 ug via INTRAVENOUS

## 2022-02-28 MED ORDER — TRANEXAMIC ACID 1,000 MG/10 ML (100 MG/ML) INTRAVENOUS SOLUTION
INTRAVENOUS | Status: AC
Start: 2022-02-28 — End: 2022-02-28
  Filled 2022-02-28: qty 10

## 2022-02-28 MED ORDER — SODIUM CHLORIDE 0.9 % INTRAVENOUS PIGGYBACK
2.0000 g | Freq: Three times a day (TID) | INTRAVENOUS | Status: DC
Start: 2022-03-01 — End: 2022-03-01
  Administered 2022-03-01: 2 g via INTRAVENOUS
  Administered 2022-03-01: 0 g via INTRAVENOUS
  Filled 2022-02-28 (×3): qty 20

## 2022-02-28 MED ORDER — SUGAMMADEX 100 MG/ML INTRAVENOUS SOLUTION
Freq: Once | INTRAVENOUS | Status: DC | PRN
Start: 2022-02-28 — End: 2022-02-28
  Administered 2022-02-28: 350 mg via INTRAVENOUS

## 2022-02-28 MED ORDER — APIXABAN 5 MG TABLET
2.5000 mg | ORAL_TABLET | Freq: Two times a day (BID) | ORAL | Status: DC
Start: 2022-03-01 — End: 2022-03-03
  Administered 2022-03-01 – 2022-03-03 (×5): 2.5 mg via ORAL
  Filled 2022-02-28 (×5): qty 1

## 2022-02-28 MED ORDER — LACTATED RINGERS INTRAVENOUS SOLUTION
INTRAVENOUS | Status: DC
Start: 2022-02-28 — End: 2022-03-03

## 2022-02-28 MED ORDER — APIXABAN 5 MG TABLET
2.5000 mg | ORAL_TABLET | Freq: Two times a day (BID) | ORAL | Status: DC
Start: 2022-02-28 — End: 2022-02-28

## 2022-02-28 MED ORDER — CEFAZOLIN 1 GRAM SOLUTION FOR INJECTION
Freq: Once | INTRAMUSCULAR | Status: DC | PRN
Start: 2022-02-28 — End: 2022-02-28
  Administered 2022-02-28: 2000 mg via INTRAVENOUS

## 2022-02-28 MED ORDER — SODIUM CHLORIDE 0.9 % IV BOLUS
40.0000 mL | INJECTION | Freq: Once | Status: AC | PRN
Start: 2022-02-28 — End: 2022-02-28

## 2022-02-28 MED ORDER — METOPROLOL TARTRATE 5 MG/5 ML INTRAVENOUS SOLUTION
5.0000 mg | INTRAVENOUS | Status: AC
Start: 2022-02-28 — End: 2022-02-28
  Administered 2022-02-28: 5 mg via INTRAVENOUS

## 2022-02-28 MED ORDER — ETOMIDATE 2 MG/ML INTRAVENOUS SOLUTION
Freq: Once | INTRAVENOUS | Status: DC | PRN
Start: 2022-02-28 — End: 2022-02-28
  Administered 2022-02-28: 16 mg via INTRAVENOUS

## 2022-02-28 MED ORDER — BISACODYL 10 MG RECTAL SUPPOSITORY
10.0000 mg | Freq: Every day | RECTAL | Status: DC | PRN
Start: 2022-02-28 — End: 2022-03-03
  Filled 2022-02-28: qty 1

## 2022-02-28 MED ORDER — PHENYLEPHRINE 10 MG/ML INJECTION SOLUTION
Freq: Once | INTRAMUSCULAR | Status: DC | PRN
Start: 2022-02-28 — End: 2022-02-28
  Administered 2022-02-28 (×3): 50 ug via INTRAVENOUS

## 2022-02-28 MED ORDER — PROCHLORPERAZINE EDISYLATE 10 MG/2 ML (5 MG/ML) INJECTION SOLUTION
5.0000 mg | Freq: Once | INTRAMUSCULAR | Status: DC | PRN
Start: 2022-02-28 — End: 2022-02-28

## 2022-02-28 MED ORDER — SODIUM CHLORIDE 0.9 % INTRAVENOUS PIGGYBACK
INJECTION | INTRAVENOUS | Status: AC
Start: 2022-02-28 — End: 2022-02-28
  Filled 2022-02-28: qty 100

## 2022-02-28 MED ORDER — TRANEXAMIC ACID 1000 MG IN NS 50 ML IVPB - ANES
Freq: Once | INTRAVENOUS | Status: DC | PRN
Start: 2022-02-28 — End: 2022-02-28
  Administered 2022-02-28: 1000 mg via INTRAVENOUS

## 2022-02-28 MED ORDER — LACTATED RINGERS INTRAVENOUS SOLUTION
INTRAVENOUS | Status: DC | PRN
Start: 2022-02-28 — End: 2022-02-28

## 2022-02-28 MED ORDER — FAMOTIDINE (PF) 20 MG/2 ML INTRAVENOUS SOLUTION
20.0000 mg | Freq: Once | INTRAVENOUS | Status: AC
Start: 2022-02-28 — End: 2022-02-28
  Administered 2022-02-28: 20 mg via INTRAVENOUS

## 2022-02-28 MED ORDER — ALBUTEROL SULFATE 2.5 MG/3 ML (0.083 %) SOLUTION FOR NEBULIZATION
2.5000 mg | INHALATION_SOLUTION | Freq: Once | RESPIRATORY_TRACT | Status: DC | PRN
Start: 2022-02-28 — End: 2022-02-28

## 2022-02-28 MED ORDER — MAGNESIUM SULFATE 1 GRAM/100 ML IN DEXTROSE 5 % INTRAVENOUS PIGGYBACK
1.0000 g | INJECTION | INTRAVENOUS | Status: AC
Start: 2022-02-28 — End: 2022-02-28
  Administered 2022-02-28 (×2): 0 g via INTRAVENOUS
  Administered 2022-02-28 (×2): 1 g via INTRAVENOUS
  Filled 2022-02-28 (×2): qty 100

## 2022-02-28 MED ORDER — SODIUM CHLORIDE 0.9 % INTRAVENOUS SOLUTION
5.0000 mg | Freq: Once | INTRAVENOUS | Status: AC
Start: 2022-02-28 — End: 2022-02-28
  Administered 2022-02-28: 0 mg via INTRAVENOUS
  Administered 2022-02-28: 5 mg via INTRAVENOUS
  Filled 2022-02-28: qty 1

## 2022-02-28 SURGICAL SUPPLY — 82 items
APPL 70% ISPRP 2% CHG 26ML 13._2X13.2IN CHLRPRP PREP DEHP-FR (MED SURG SUPPLIES) ×1
APPL 70% ISPRP 2% CHG 26ML CHLRPRP HI-LT ORNG PREP STRL LF  DISP CLR (MED SURG SUPPLIES) ×1 IMPLANT
BAG BIOHAZ RD 30X24IN THK3 MIL C8-10GL LLDPE INFCT WASTE CAN (MED SURG SUPPLIES) ×1
BAG BIOHAZ RD 30X24IN THK3 MIL C8-10GL LLDPE INFCT WASTE CAN PNCT RST (MED SURG SUPPLIES) ×1 IMPLANT
BIT DRILL 180MM 4.2MM SM AO COUPLE STRL (SURGICAL CUTTING SUPPLIES) ×1 IMPLANT
BIT DRILL 180MM 4.2MM SM AO CO_UPLE STRL (CUTTING ELEMENTS) ×1
BLADE 10 2 END CBNSTL SURG STRL DISP (CUTTING ELEMENTS) ×2
BLADE 10 2 END CBNSTL SURG STRL DISP (SURGICAL CUTTING SUPPLIES) ×2 IMPLANT
BLADE 15 2 END CBNSTL SURG STRL DISP (CUTTING ELEMENTS) ×1
BLADE 15 2 END CBNSTL SURG STRL DISP (SURGICAL CUTTING SUPPLIES) ×1 IMPLANT
CLEANER INSTR PREPZYME MUL-TRD CONTAINR NARSL NEUT PH BDGR (MISCELLANEOUS PT CARE ITEMS) ×1
CONV USE ITEM 321837 - GLOVE SURG 7.5 LTX PF NONST CRM (GLOVES AND ACCESSORIES) ×1 IMPLANT
CONV USE ITEM 321852 - GLOVE SURG 6.5 LF  PLISPRN (GLOVES AND ACCESSORIES) ×1 IMPLANT
CONV USE ITEM 321983 - GLOVE SURG 8 LTX CHEMO PF SMOOTH BEAD CUF STRL WHT 11.6IN PLMR THK.2MM THK.21MM (GLOVES AND ACCESSORIES) ×1 IMPLANT
CONV USE ITEM 322852 - ROLL ECODRI-SAFE ABS POLY BCK ORTHO (ORTHOPEDICS (NOT IMPLANTS)) ×2 IMPLANT
CONV USE ITEM 323185 - PAD EG 15SQ IN UNIV FOAM SPLT NONCORD ADULT 9100 SER (SURGICAL CUTTING SUPPLIES) ×1 IMPLANT
CONV USE ITEM 329146 - CLEANER INSTR PREPZYME MUL-TRD CONTAINR NARSL NEUT PH BDGR 22OZ (MISCELLANEOUS PT CARE ITEMS) ×1 IMPLANT
CONV USE ITEM 34153 - ELECTRODE ESURG BLADE PNCL 3/32IN STRL SS CAUT PSHBTN STD SHAFT LF  VEGA SER (SURGICAL CUTTING SUPPLIES) ×1 IMPLANT
CONV USE ITEM 343591 - SOLIDIFY FLUID 1500CC NONST LF  PREM SOLIDIFY + (MED SURG SUPPLIES) ×1 IMPLANT
CONV USE ITEM 45435 - STRIP SKNCLS MDSTRP FBR NYL POR MED 4X.5IN ADH HYPOALL REINF (SUTURE/WOUND CLOSURE) ×1 IMPLANT
CONV USE ITEM 81225 - BAG BIOHAZ RD 30X24IN THK3 MIL C8-10GL LLDPE INFCT WASTE CAN PNCT RST (MED SURG SUPPLIES) ×1 IMPLANT
COUNTER 20 CNT BLOCK ADH NEEDLE STRL LF  RD SHARP FOAM 15.75X11.5X14IN DISP (MED SURG SUPPLIES) ×1 IMPLANT
COUNTER 20 CNT BLOCK ADH NEEDLE STRL LF RD SHARP FOAM 15.75 (MED SURG SUPPLIES) ×1
DRAPE 2 INCS FILM ANTIMIC 33X23IN IOBN STRL SURG (DRAPE/PACKS/SHEETS/OR TOWEL) ×1 IMPLANT
DRAPE 2 INCS FILM ANTIMIC 33X2_3IN IOBN STRL SURG (DRAPE/PACKS/SHEETS/OR TOWEL) ×1
DRAPE 2 INCS FILM PCH ISOL ADH STRP 125X83IN STRDRP IOBN STRL SURG PLASTIC 19INX9 3/8IN CLR (DRAPE/PACKS/SHEETS/OR TOWEL) ×1 IMPLANT
DRAPE 2 INCS FILM PCH ISOL ADH_STRP 125X83IN STRDRP IOBN (DRAPE/PACKS/SHEETS/OR TOWEL) ×1
DRAPE CARM FLRSCP EXPD CLPSBL C-ARMOR STRL EQP (DRAPE/PACKS/SHEETS/OR TOWEL) ×1 IMPLANT
DRAPE CARM FLRSCP EXPD CLPSBL_C-ARMOR STRL EQP (DRAPE/PACKS/SHEETS/OR TOWEL) ×1
DRAPE CARM MBL XRY BAND 64X42IN LRG EQP RUB (DRAPE/PACKS/SHEETS/OR TOWEL) ×1 IMPLANT
DRAPE CARM MBL XRY BAND 64X42I_N LRG EQP RUB (DRAPE/PACKS/SHEETS/OR TOWEL) ×1
DRAPE FNFLD ABS REINF 77X53IN 43528 PRXM LF  STRL DISP SURG SMS 44X23IN (DRAPE/PACKS/SHEETS/OR TOWEL) ×1 IMPLANT
DRAPE FNFLD ABS REINF 77X53IN_43528 PRXM LF STRL DISP SURG (DRAPE/PACKS/SHEETS/OR TOWEL) ×1
DRESS 10X4IN POSTOP STRL MPLX BR FOAM DISP (WOUND CARE SUPPLY) ×1 IMPLANT
DRESS 10X4IN POSTOP STRL MPLX BR FOAM DISP (WOUND CARE/ENTEROSTOMAL SUPPLY) ×1
DRESS 6X4IN AL-1 POSTOP MPLX BR FOAM DISP (WOUND CARE SUPPLY) ×1 IMPLANT
DRESS 6X4IN AL-1 POSTOP MPLX BR FOAM DISP (WOUND CARE/ENTEROSTOMAL SUPPLY) ×1
ELECTRODE ESURG BLADE PNCL 3/32IN STRL SS CAUT PSHBTN STD (CUTTING ELEMENTS) ×1
GLOVE SURG 6.5 LF  PF BEAD CUF SMOOTH TXTR STRL GRN 12IN SENSICARE PLISPRN SYN PLMR ALOE THK7.9 MIL (GLOVES AND ACCESSORIES) ×1 IMPLANT
GLOVE SURG 6.5 LF PF BEAD CUF SMOOTH TXTR STRL GRN 12IN (GLOVES AND ACCESSORIES) ×1
GLOVE SURG 6.5 LF PF SMOOTH STRL WHT PLISPRN (GLOVES AND ACCESSORIES) ×1
GLOVE SURG 7.5 LF  PF SMOOTH BEAD CUF STRL GRN 12IN SENSICARE PI GRN PLISPRN PLMR ALOE THK7.9 MIL (GLOVES AND ACCESSORIES) ×1 IMPLANT
GLOVE SURG 7.5 LF PF SMOOTH BEAD CUF STRL GRN 12IN (GLOVES AND ACCESSORIES) ×1
GLOVE SURG 7.5 LF PF SMOOTH STRL WHT PLISPRN (GLOVES AND ACCESSORIES) ×1
GLOVE SURG 7.5 LTX PF SMOOTH STRL CRM (GLOVES AND ACCESSORIES) ×1
GLOVE SURG 8 LTX PF SMOOTH STRL CRM (GLOVES AND ACCESSORIES) ×1
GLOVE SURG LF  PF STRL 7.5 PLISPRN DISP (GLOVES AND ACCESSORIES) ×1 IMPLANT
GOWN SURG XL STD LGTH L3 HKLP CLSR RGLN SLEEVE TWL STRL LF (DRAPE/PACKS/SHEETS/OR TOWEL) ×1
GOWN SURG XL STD LGTH L3 HKLP CLSR RGLN SLEEVE TWL STRL LF  DISP GRN AERO BLU PRFRM FBRC (DRAPE/PACKS/SHEETS/OR TOWEL) ×1 IMPLANT
GOWN SURG XL XLNG L4 REINF HKLP CLSR SET IN SLEEVE STRL LF (DRAPE/PACKS/SHEETS/OR TOWEL) ×1
GOWN SURG XL XLNG L4 REINF HKLP CLSR SET IN SLEEVE STRL LF  DISP BLU SIRUS SMS PE 56IN (DRAPE/PACKS/SHEETS/OR TOWEL) ×1 IMPLANT
GW ORTHO 3MM 800MM T2 BALL TIP TI FEM NAIL SYS STRL (MED SURG SUPPLIES) ×1 IMPLANT
GW ORTHO 3MM 800MM T2 BALL TIP_TI FEM NAIL SYS STRL (MED SURG SUPPLIES) ×1
HDPE THK22 UM C40-45 GL L48 IN X W40 IN NATURAL (MISCELLANEOUS PT CARE ITEMS) ×4 IMPLANT
LABEL MED CORRECT MED LABELING SYS 4 FLG 2 SHEET 24 PRPRNT (MED SURG SUPPLIES) ×1
LABEL MED CORRECT MED LABELING SYS 4 FLG 2 SHEET 24 PRPRNT STRL (MED SURG SUPPLIES) ×1 IMPLANT
NAIL 11MM TI LOCK FEM RGT 400MM STRL GAMMA3 INTRAMED 125D R1.5 (IMPLANTS TRAUMA) ×1 IMPLANT
PAD ABDOMINAL 7.5X8 STRL (WOUND CARE/ENTEROSTOMAL SUPPLY) ×2
PAD ABDOMINAL 8X7.5IN LF  STRL (WOUND CARE SUPPLY) ×2 IMPLANT
PAD EG 15SQ IN UNIV FOAM SPLT NONCORD ADULT 9100 SER (CUTTING ELEMENTS) ×1
REAMER SURG 8MM MOD TRNKL CLRNC HI RLB LFRIC SURF FNSH 448MM BIXCUT STRL DISP (SURGICAL INSTRUMENTS) ×1 IMPLANT
ROLL ECODRI-SAFE ABS POLY BCK ORTHO (ORTHOPEDICS (NOT IMPLANTS)) ×2
SCREW BONE 5MM 45MM LOCK TI F/T STRL T2 INTRAMED NAIL SYS (IMPLANTS TRAUMA) ×1 IMPLANT
SCREW BONE GAMMA3 10.5MM 85MM LG TI STRL TROCH NAIL 180 (IMPLANTS TRAUMA) ×1 IMPLANT
SHAFT REAMER 450MM BIXCUT MOD_HALL MODL STRL (INSTRUMENTS) ×1
SOLIDIFY FLUID 1500CC NONST LF  PREM SOLIDIFY + (MED SURG SUPPLIES) ×1 IMPLANT
SOLIDIFY FLUID 1500CC NONST LF PREM SOLIDIFY + (MED SURG SUPPLIES) ×1
SPONGE LAP 18X18IN PREWASH RIGID TRY STRL LF  WHT (MED SURG SUPPLIES) ×2 IMPLANT
SPONGE LAP 18X18IN PREWASH RIGID TRY STRL LF WHT (MED SURG SUPPLIES) ×2
STAPLER SKIN 4.1X6.5MM 35 W STPL CART LF  APS U DISP CLR SS PLASTIC (SUTURE/WOUND CLOSURE) ×1 IMPLANT
STAPLER SKIN WIDE 35W_8886803712 12/BX (SUTURE/WOUND CLOSURE) ×1
STRIP SKNCLS MDSTRP FBR NYL POR MED 4X.5IN ADH HYPOALL REINF (SUTURE/WOUND CLOSURE) ×1
SUTURE 1 GS-22 POLYSRB 30IN VIOL BRD COAT ABS (SUTURE/WOUND CLOSURE) ×2 IMPLANT
SUTURE 2-0 C-23 POLYSRB 30IN UNDYED BRD COAT ABS (SUTURE/WOUND CLOSURE) ×2 IMPLANT
SUTURE 2-0 GS-21 POLYSRB 30IN UNDYED BRD COAT ABS (SUTURE/WOUND CLOSURE) ×1 IMPLANT
SUTURE 2-0 GS-21 POLYSRB_30IN BRD COAT ABS (SUTURE/WOUND CLOSURE) ×1
SUTURE 2-0 GS-22 POLYSRB 30IN VIOL BRD COAT ABS (SUTURE/WOUND CLOSURE) ×2 IMPLANT
TUBING SUCT CLR 6FT .25IN ARGYLE PVC NCDTV STR MALE FEMALE (MED SURG SUPPLIES) ×1
TUBING SUCT CLR 6FT .25IN ARGYLE PVC NCDTV STR MALE FEMALE MLD CONN STRL LF (MED SURG SUPPLIES) ×1 IMPLANT
WIRE FIX 3.2MM 450MM KIRSCH STRL (IMPLANTS TRAUMA) ×2 IMPLANT
WOUND IRRG IRRISEPT DBRD CLNSG 0.05% CHG SYSTEM STRL LF (WOUND CARE SUPPLY) ×1 IMPLANT
WOUND IRRG IRRISEPT DBRD CLNSG_0.05% CHG SYSTEM STRL LF (WOUND CARE/ENTEROSTOMAL SUPPLY) ×1

## 2022-02-28 NOTE — Nurses Notes (Addendum)
Called lab to ask if Vitamin K will be sent or in pyxis, they will send.  Patient tolerated FFP well.     Patient repositioned with 2 assist.     FFP started at 12:00 per MD orders. Patient continues with right shoulder pain, will notify charge RN. Charge RN states providers have assessed her right shoulder.  Ice applied off and on today to help with right shoulder pain. Patient noted to also have bilateral bruising on her hands, assuming due to her recent fall.

## 2022-02-28 NOTE — Discharge Instructions (Addendum)
Orthopaedic Discharge Instructions:    Weightbear as tolerated to the operative extremity with use of an assistive device as needed upon ambulation    Maintain surgical dressing until postoperative day 7.  May remove dressing at this time and leave incision open to air if dry.  If there is persistent drainage please cover with a gauze dressing and change daily. Please call the clinic with any erythema or purulent drainage concerning for infection.    May shower, no tub baths until follow-up     Take pain medication and blood thinners as prescribed. Attend physical therapy as instructed.    Follow-up in the orthopedics clinic at your scheduled appointment in approximately 2-3 weeks.  Please call the office to confirm your appointment.  Please also call with any questions or concerns.      [facility]  [address], [place], [place] [address]  [phone]

## 2022-02-28 NOTE — Anesthesia Preprocedure Evaluation (Addendum)
ANESTHESIA PRE-OP EVALUATION  Planned Procedure: OPEN REDUCTION INTERNAL FIXATION RIGHT HIP USING LONG GAMMA NAIL (Right: Hip)  Review of Systems     anesthesia history negative     patient summary reviewed  nursing notes reviewed        Pulmonary  negative pulmonary ROS,  Pulmonary hypertension,   Cardiovascular    Hypertension, well controlled, valvular problems/murmurs, ECG reviewed, LVEF=45-50%, atrial fibrillation, ACE / ARB inhibitor use, mitral regurgitation and tricuspid regurgitation ,No peripheral edema,  Exercise Tolerance: > or = 4 METS        GI/Hepatic/Renal   negative GI/hepatic/renal ROS,         Endo/Other    anemia, osteoarthritis and drug induced coagulopathy (Eliquis last dose 3 days ago),      Neuro/Psych/MS    back abnormality     Cancer  CA (Multiple myeloma),               Physical Assessment      Airway       Mallampati: III    TM distance: >3 FB    Neck ROM: full  Mouth Opening: good.            Dental           (+) upper dentures, lower dentures           Pulmonary    Comment: Pulmonary hypertension  Breath sounds clear to auscultation  (-) no rhonchi, no decreased breath sounds, no wheezes, no rales and no stridor     Cardiovascular    Rhythm: regular  Rate: Normal  (-) no friction rub, carotid bruit is not present, no peripheral edema and no murmur     Other findings            Plan  ASA 4 - emergent     Planned anesthesia type: general     general anesthesia with endotracheal tube intubation    plan to administer opioids postoperatively            PONV/POV Plan:  I plan to administer pharmcologic prophalaxis antiemetics  Intravenous induction     Anesthesia issues/risks discussed are: Stroke, Nerve Injuries, Intraoperative Awareness/ Recall, Eye /Visual Loss, Aspiration, PONV, Blood Loss, Sore Throat and Cardiac Events/MI.  Anesthetic plan and risks discussed with patient  Signed consent obtained        Use of blood products discussed with patient who consented to blood products.      Patient's NPO status is appropriate for Anesthesia.           Plan discussed with CRNA.    (Cardiology clearance by Anderson Malta MD. K=3, Patient was given Kdur 40 meq PO today)

## 2022-02-28 NOTE — Anesthesia Transfer of Care (Signed)
ANESTHESIA TRANSFER OF CARE   Katelyn Craig is a 81 y.o. ,female, Weight: 67 kg (147 lb 11.2 oz)   had Procedure(s):  OPEN REDUCTION INTERNAL FIXATION RIGHT HIP USING LONG GAMMA NAIL  performed  02/28/22   Primary Service: Dreama Saa, MD    Past Medical History:   Diagnosis Date   . Cancer (CMS [place])    . HTN (hypertension)    . Multiple myeloma (CMS HCC)       Allergy History as of 02/28/22     OXYCODONE       Noted Status Severity Type Reaction    02/25/22 0013 Karna Dupes, GN 05/13/14 Active Low  Swelling    Comments: Throat swelling; takes hydrocodone at home               I completed my transfer of care / handoff to the receiving personnel during which we discussed:  Access, Airway, All key/critical aspects of case discussed, Analgesia, Antibiotics, Expectation of post procedure, Fluids/Product, Gave opportunity for questions and acknowledgement of understanding, Labs and PMHx  Report given to: Hildred Priest, RN    Post Location: PACU                                                           Last OR Temp: Temperature: 36.2 C (97.2 F)  ABG:  POTASSIUM   Date Value Ref Range Status   02/28/2022 3.0 (L) 3.5 - 5.1 mmol/L Final     KETONES   Date Value Ref Range Status   02/24/2022 20 (A) Negative, Trace mg/dL Final     CALCIUM   Date Value Ref Range Status   02/28/2022 7.9 (L) 8.6 - 10.3 mg/dL Final     Calculated R Axis   Date Value Ref Range Status   02/26/2022 96 degrees Final     Calculated T Axis   Date Value Ref Range Status   02/26/2022 -39 degrees Final     Airway:* No LDAs found *  Blood pressure 129/82, pulse (!) 108, temperature 36.2 C (97.2 F), resp. rate 17, height 1.727 m (5' 8" ), weight 67 kg (147 lb 11.2 oz), SpO2 100 %.

## 2022-02-28 NOTE — OR Surgeon (Signed)
ORTHOPAEDIC SURGERY OPERATIVE REPORT:    Patient Name: Katelyn Craig  Date of Birth: [birth year]  Age: 81 y.o.  Gender: female  MRN: [id number]    Date of Surgery: 02/28/2022     Surgeon: Murvin Donning, DO    Preoperative Diagnosis:  Comminuted right IT femur fracture    Postoperative Diagnosis:  Comminuted right IT femur fracture    Procedure(s): Open reduction internal fixation right hip with long cephalomedullary nail    Type of Anesthesia: General    Anesthesia Staff: Anesthesiologist: Glori Bickers, MD  CRNA: Nadeen Landau, CRNA   OR Staff: Circulator: Blythe Stanford, RN  PERIOPERATIVE CARE ASSISTANT: Delane Ginger, PCA  Relief Circulator: Leonia Reeves, RN  Scrub Person: Derinda Sis, ST  X-Ray Technologist: Maralyn Sago, RT (R)  Scrub First Assist: Cristine Polio, ST   Implants: [employer]ker long gamma nail 11 x 400 x 125 degree, 85 mm lag screw, appropriately-sized distal interlocking screw      Estimated Blood Loss:  50 mL    Counts:     Sponge: Correct    Needle: Correct    Sharp: Correct    Instrument: Correct    Drains and/or Packs: None    Significant Events: None    Specimen(s) Removed: * No specimens in log *     Description of Findings: Routine findings consistent with pre-operative diagnosis.    Post-Op Condition of Patient: Stable to PACU    Brief History:     Katelyn Craig is a 81 y.o. female who presents to [facility] for right hip pain status post mechanical fall.  Diagnosis comminuted right intertrochanteric femur fracture for which Orthopedics was consulted.  Patient was seen evaluated and treatment options discussed.  I did recommend proceeding with operative intervention to promote early ambulation and stabilization.  Patient was medically optimized for surgery and cleared to proceed.  She did receive FFP prior to surgery today.    Procedure discussed in great detail.  Risks, benefits, potential complications and outcomes were discussed at length and patient verbalized an  understanding and provided informed consent to proceed. Patient underwent appropriate pre-operative clearance and is stable to proceed.    Procedure:     Patient was seen and examined in the preoperative area.  H&P reviewed and written consent confirmed.   right hip marked as the correct operative site.  Appropriate preoperative antibiotics were administered at this time.  Patient then taken back to the operative suite where general anesthesia was administered.  Once adequate anesthesia was achieved patient was transferred to the OR table in the supine position.  All bony prominences covered and padded appropriately.  Patient was then positioned properly and preliminary reduction achieved and confirmed on biplanar fluoroscopy.      Right hip was then prepped and draped in normal sterile fashion.  Formal time-out was then performed.  Upon completion of timeout longitudinal incision in line with the femur just proximal to the greater trochanter was carried out.  Sharp dissection carried through skin and subcutaneous tissue.  Dissection was carried down to the level of the greater trochanter.  The awl was then used to identify an appropriate starting point for the nail.  Guidewire was inserted through the awl into the femoral shaft down to level of the knee.  Awl was removed.  Proper positionin of the guidewire was confirmed on biplanar fluoroscopy.  At this time the opening Reamer was used to gain entry into the femoral canal.   we then reamed sequentially to  the appropriate size for an 11 nail.  Then took a measurement to determine appropriate length for the nail.    Gamma nail was inserted over the guidewire to the appropriate depth and confirmed on biplanar fluoroscopy.  Guidewire was removed.  At this time additional incision was made distal to the 1st for passage of our guidewire into the head and neck.  Guidewire was placed into the appropriate position in the femoral head and neck and confirmed on biplanar  fluoroscopy.  We then measured and reamed to the appropriate depth.  Lag screw was then placed and guidewire removed.  Fracture was compressed through the jig.  The lag screw was then locked to the nail proximally using the set screw.      At this time nail was locked statically using an appropriately-sized distal interlocking screw and perfect circle freehand technique.    Final biplanar fluoroscopy demonstrated proper reduction of the fracture as well as appropriate positioning of all orthopedic hardware.  All incisions were then thoroughly irrigated with IrriSept solution.  Closure was performed in layered fashion.  IT band and TFL were closed with 0 Vicryl in figure-of-eight fashion.  Subcutaneous tissue closed with 2-0 Vicryl in buried interrupted fashion.  Skin was approximated using staples.  Incisions were then covered with Mepilex dressings.  Patient was then awoken from anesthesia and transferred back to the hospital gurney.  Was taken to PACU in stable and satisfactory condition.  Patient tolerated the procedure well without complication.    Disposition:     Patient taken to PACU in stable and satisfactory condition. Patient will be transferred back to the hospital floor once cleared per PACU protocol.  May remain weight-bearing as tolerated to the operative extremity with use of an assistive device as needed for ambulation.  Maintain dressing, reinforced as needed.  Perioperative antibiotics ordered.  Analgesia.  Physical therapy and DVT prophylaxis to begin on postoperative day 1.  Anticipated discharge to rehab facility.  Orthopedics will continue to follow.  Please page with questions or concerns.    Rebeca Alert, D.O.  02/28/22 18:56  [place]  Please call with any questions/concerns     This note has been created with voice recognition software.  Please excuse any errors in transcription.  Occasional wrong word or sound alike substitutions may have occurred due to the  inherent limitations of voice recognition software.  Please read the chart carefully and recognize using context with the substitutions may have occurred.

## 2022-02-28 NOTE — Progress Notes (Signed)
ORTHOPAEDIC SURGERY PROGRESS NOTE    Patient Name: Katelyn Craig   MRN: [id number]   Date of Birth: [birth year]  Date: 02/28/2022  Age: 81 y.o.    Gender: female  Attending Physician: Salvatore Marvel, MD    Patient seen evaluated this morning.  Labs reviewed.  INR remains at 1.5 upon repeat this morning despite holding Eliquis.  Case discussed with internal medicine team who has ordered FFP.  We will plan to proceed with operative intervention today after patient receives her FFP for stabilization of the right hip.  Procedure discussed once again with family at bedside this morning.  Remain NPO until surgery this afternoon.    Rebeca Alert, D.O.  02/28/22 09:11  [place]  Please call with any questions/concerns     This note has been created with voice recognition software.  Please excuse any errors in transcription.  Occasional wrong word or sound alike substitutions may have occurred due to the inherent limitations of voice recognition software.  Please read the chart carefully and recognize using context with the substitutions may have occurred.

## 2022-02-28 NOTE — Progress Notes (Signed)
[facility]   IP PROGRESS NOTE      Katelyn Craig  Date of Admission:  02/26/2022  Date of Birth:  Apr 13, 1941  Date of Service:  02/28/2022    Hospital Day:  LOS: 2 days         HPI:  Katelyn Craig is Craig 81 y.o. female admitted with right hip fracture, AFib RVR.  Heart rate is better controlled today.  She is for surgical repair later on today. Eliquis will be restarted after surgery once okay with Orthopedic surgeon.    Review of Systems:  General: No fever or chills. No weight changes, fatigue, weakness.   HEENT: No headaches, dizziness, changes in vision, changes in hearing, or difficulty swallowing.    Skin:  No rashes, erythema or bruises.   Cardiac: No chest pain, palpitations, or arrhythmia.    Respiratory: No shortness of breath, cough, or wheezing.  GI: No nausea or vomiting. No abdominal pain.   Urinary: No dysuria, hematuria, or change in frequency.    Vascular: No edema.     Musculoskeletal:  Persistent pain right hip   Neurologic: No loss of sensation, numbness or tingling.   Endocrine: No heat or cold intolerance or polydipsia.   Psychiatric: No insomnia, depression or anxiety.    Physical Examination  General: Patient is alert and oriented to person, place, and time. No acute distress. Communicates appropriately.   Head: Normocephalic and atraumatic.    Eyes: Pupils equally round and react to light and accommodate. Extraocular movements intact.  Conjunctiva normal. Sclerae are normal.    Nose: Nasal passages clear. Mucosa moist.    Throat: Moist oral mucosa. No erythema or exudate of the pharynx. Clear oropharynx.    Neck: Supple. No cervical lymphadenopathy or supraclavicular nodes detected. Trachea midline   Heart:  Irregularly irregular, no murmur heart rate control   Lungs: Clear to auscultation bilaterally with no wheezes or rales. Equal chest excursion.  No conversational dyspnea. No respiratory distress noted.   Abdomen: Soft, nontender, nondistended belly. Bowel sounds are  present in all four quadrants. No rigidity.  No guarding.  No ascites.   Extremities: No edema, cyanosis, or clubbing.    Skin: Warm and dry without lesions. No ecchymosis noted.    Neurologic: Cranial nerves II through XII are grossly intact. Sensation to light touch is intact. Strength 5/5 in upper extremities and lower extremities bilaterally.    Genitourinary:  No urinary incontinence or Foley catheter   Psychiatric: Judgment and insight are intact. Mood and affect are appropriate for the situation.      Vital Signs:  Temp (24hrs) Max:37.7 C (99.8 F)      Temperature: 36.2 C (97.2 F)  BP (Non-Invasive): (!) 149/75  MAP (Non-Invasive): 80 mmHG  Heart Rate: 88  Respiratory Rate: 18  SpO2: 94 %    Current Medications:  alcohol 62 % (NOZIN NASAL SANITIZER) nasal solution, 1 Each, Each Nostril, 2x/day  docusate sodium (COLACE) capsule, 100 mg, Oral, 2x/day  [Held by provider] fentaNYL (DURAGESIC) patch 50 mcg/hr, 1 Patch, Transdermal, Q72H  guaiFENesin (MUCINEX) extended release tablet - for cough (expectorant), 600 mg, Oral, 2x/day  HYDROcodone-acetaminophen (NORCO) 10-325 mg per tablet, 1 Tablet, Oral, Q6H PRN  HYDROmorphone (DILAUDID) 2 mg/mL injection, 1.2 mg, Intravenous, Q3H PRN  levoFLOXacin (LEVAQUIN) tablet 750 mg, 750 mg, Oral, Daily  LORazepam (ATIVAN) tablet, 0.5 mg, Oral, Q6H PRN  magnesium sulfate 1 G in D5W 100 mL premix IVPB, 1 g, Intravenous, Q1H  metoprolol succinate (TOPROL-XL) 24 hr extended release tablet, 25 mg, Oral, 2x/day  NS bolus infusion 40 mL, 40 mL, Intravenous, Once PRN  ondansetron (ZOFRAN) 2 mg/mL injection, 4 mg, Intravenous, Q8H PRN  polyethylene glycol (MIRALAX) oral packet, 17 g, Oral, Daily  potassium chloride (K-DUR) extended release tablet, 40 mEq, Oral, 3x/day  valsartan (DIOVAN) tablet, 80 mg, Oral, 2x/day        Current Orders:  Active Orders   Imaging    FLUORO LOWER EXTREMITY IN OR RIGHT     Frequency: ONE TIME     Number of Occurrences: 1 Occurrences     Scheduling  Instructions:               Lab    BASIC METABOLIC PANEL - AM ONCE     Frequency: 0530 - AM DRAW     Number of Occurrences: 1 Occurrences    CBC/DIFF - AM ONCE     Frequency: 0530 - AM DRAW     Number of Occurrences: 1 Occurrences    MAGNESIUM - AM ONCE     Frequency: 0530 - AM DRAW     Number of Occurrences: 1 Occurrences   Diet    DIET NPO - SPECIFIC DATE & TIME     Frequency: ALL MEALS (NPO ONLY)     Number of Occurrences: 1 Occurrences   Nursing    ACTIVITY Activity: BEDREST; Instructions: FLAT     Frequency: UNTIL DISCONTINUED     Number of Occurrences: Until Specified    APPLY SEQUENTIAL COMPRESSION DEVICE     Frequency: ONE TIME     Number of Occurrences: 1 Occurrences    BLADDER SCAN PRN IF NO VOID     Frequency: PRN     Number of Occurrences: 1 Occurrences     Order Comments: If scanned urine volume greater than 300 ML, straight cath and call MD of urine cath volume. (enter secondary order for straight cath x1).  If scanned urine volume less than 300 ML rescan in 2 hours and notify MD of scanned volume.      CONTINUOUS CARDIAC MONITORING (ED USE ONLY)     Frequency: ONE TIME     Number of Occurrences: 1 Occurrences    FOLEY CATHETER INSERT & MAINTAIN     Frequency: UNTIL DISCONTINUED     Number of Occurrences: Until Specified    INCENTIVE SPIROMETRY NURSING     Frequency: Q1H WA     Number of Occurrences: 250 Occurrences    INDWELLING URINARY CATHETER CARE QSHIFT     Frequency: QSHIFT     Number of Occurrences: Until Specified    INTAKE AND OUTPUT Q8H     Frequency: Q8H     Number of Occurrences: Until Specified    MAINTAIN SEQUENTIAL COMPRESSION DEVICE     Frequency: CONTINUOUS X 72 HRS     Number of Occurrences: 72 Hours     Order Comments: To LLE only      MISCELLANEOUS MD/DO TO NURSE     Frequency: UNTIL DISCONTINUED     Number of Occurrences: Until Specified     Order Comments: Pls complete meds hx so Admission MedRec can be completed.  Thx.      Notify MD Vital Signs     Frequency: PRN     Number of  Occurrences: Until Specified    NURSING-TRANSFUSE FFP/PLASMA - UNITS : 1 Units     Frequency: Transfusion     Number of Occurrences: 1  Occurrences     Order Comments: Please see the listed hyperlinks for additional information on the recommended infusion rates for blood products as determined by the [employer] ([employer]).      PT IS HIGH RISK FOR VENOUS THROMBOEMBOLISM     Frequency: CONTINUOUS     Number of Occurrences: Until Specified    PULSE OXIMETRY Q4H     Frequency: Q4H     Number of Occurrences: Until Specified    TELEMETRY MONITORING - Continuous     Frequency: CONTINUOUS     Number of Occurrences: Until Specified    VITAL SIGNS MULTI FREQUENCY     Frequency: UNTIL DISCONTINUED     Number of Occurrences: Until Specified    VITAL SIGNS Q4H     Frequency: Q4H     Number of Occurrences: Until Specified   Code Status    FULL CODE     Frequency: CONTINUOUS     Number of Occurrences: Until Specified   Consult    IP CONSULT TO CARDIOLOGY Requested Provider; Anderson Malta Craig     Frequency: ONE TIME     Number of Occurrences: 1 Occurrences     Order Comments: Afib RVR,  pre op cardiac risk  stratification      IP CONSULT TO ORTHOPEDICS Requested Provider; Mervyn Skeeters     Frequency: ONE TIME     Number of Occurrences: 1 Occurrences   Respiratory Care    INCENTIVE SPIROMETRY - RT INSTRUCT     Frequency: ONE TIME     Number of Occurrences: 1 Occurrences    OXYGEN - NASAL CANNULA     Frequency: PRN     Number of Occurrences: Until Specified     Order Comments: Flowrate should not exeed 6L/min except [facility] facility.           PULSE OXIMETRY - CONTINUOUS     Frequency: CONTINUOUS     Number of Occurrences: Until Specified   Medications    alcohol 62 % (NOZIN NASAL SANITIZER) nasal solution     Frequency: 2x/day     Dose: 1 Each     Route: Each Nostril    docusate sodium (COLACE) capsule     Frequency: 2x/day     Dose: 100 mg     Route: Oral    fentaNYL (DURAGESIC) patch 50 mcg/hr     Frequency: Q72H      Dose: 1 Patch     Route: Transdermal    guaiFENesin (MUCINEX) extended release tablet - for cough (expectorant)     Frequency: 2x/day     Dose: 600 mg     Route: Oral    HYDROcodone-acetaminophen (NORCO) 10-325 mg per tablet     Frequency: Q6H PRN     Dose: 1 Tablet     Route: Oral    HYDROmorphone (DILAUDID) 2 mg/mL injection     Frequency: Q3H PRN     Dose: 1.2 mg     Route: Intravenous    levoFLOXacin (LEVAQUIN) tablet 750 mg     Frequency: Daily     Dose: 750 mg     Route: Oral    LORazepam (ATIVAN) tablet     Frequency: Q6H PRN     Dose: 0.5 mg     Route: Oral    magnesium sulfate 1 G in D5W 100 mL premix IVPB     Frequency: Q1H     Dose: 1 g     Route: Intravenous  metoprolol succinate (TOPROL-XL) 24 hr extended release tablet     Frequency: 2x/day     Dose: 25 mg     Route: Oral    NS bolus infusion 40 mL     Frequency: Once PRN     Dose: 40 mL     Route: Intravenous    ondansetron (ZOFRAN) 2 mg/mL injection     Frequency: Q8H PRN     Dose: 4 mg     Route: Intravenous    polyethylene glycol (MIRALAX) oral packet     Frequency: Daily     Dose: 17 g     Route: Oral    potassium chloride (K-DUR) extended release tablet     Frequency: 3x/day     Dose: 40 mEq     Route: Oral    valsartan (DIOVAN) tablet     Frequency: 2x/day     Dose: 80 mg     Route: Oral          I/O:  I/O last 24 hours:      Intake/Output Summary (Last 24 hours) at 02/28/2022 1013  Last data filed at 02/28/2022 1975  Gross per 24 hour   Intake 460 ml   Output 1250 ml   Net -790 ml     I/O current shift:  06/14 0700 - 06/14 1859  In: 100   Out: -       Labs (Please indicate ordered or reviewed)  Reviewed: Lab Results Today:   Results for orders placed or performed during the hospital encounter of 02/26/22 (from the past 24 hour(s))   MAGNESIUM   Result Value Ref Range    MAGNESIUM 1.8 (L) 1.9 - 2.7 mg/dL   BASIC METABOLIC PANEL   Result Value Ref Range    SODIUM 138 136 - 145 mmol/L    POTASSIUM 3.0 (L) 3.5 - 5.1 mmol/L    CHLORIDE 103 98 - 107  mmol/L    CO2 TOTAL 27 21 - 31 mmol/L    ANION GAP 8 (L) 10 - 20 mmol/L    CALCIUM 7.9 (L) 8.6 - 10.3 mg/dL    GLUCOSE 121 (H) 74 - 109 mg/dL    BUN 17 7 - 25 mg/dL    CREATININE 0.65 0.60 - 1.30 mg/dL    BUN/CREA RATIO 26 (H) 6 - 22    ESTIMATED GFR 90 >59 mL/min/1.36m2    OSMOLALITY, CALCULATED 279 270 - 290 mOsm/kg   PT/INR - AM ONCE   Result Value Ref Range    PROTHROMBIN TIME 17.3 (H) 9.8 - 12.7 seconds    INR 1.50 <=5.00   CBC WITH DIFF   Result Value Ref Range    WBCS UNCORRECTED 5.6 x10^3/uL    WBC 5.6 4.0 - 10.5 x10^3/uL    RBC 3.41 (L) 4.20 - 5.40 x10^6/uL    HGB 10.7 (L) 12.5 - 16.0 g/dL    HCT 30.1 (L) 37.0 - 47.0 %    MCV 88.1 78.0 - 99.0 fL    MCH 31.2 27.0 - 32.0 pg    MCHC 35.4 32.0 - 36.0 g/dL    RDW 15.2 (H) 11.6 - 14.8 %    PLATELETS 155 140 - 440 x10^3/uL    MPV 9.6 7.4 - 10.4 fL    NEUTROPHIL % 66 40 - 76 %    LYMPHOCYTE % 14 (L) 25 - 45 %    MONOCYTE % 20 (H) 0 - 12 %    EOSINOPHIL % 0 0 -  7 %    BASOPHIL % 0 0 - 3 %    NEUTROPHIL # 3.70 1.80 - 8.40 x10^3/uL    LYMPHOCYTE # 0.80 (L) 1.10 - 5.00 x10^3/uL    MONOCYTE # 1.10 0.00 - 1.30 x10^3/uL    EOSINOPHIL # 0.00 0.00 - 0.80 x10^3/uL    BASOPHIL # 0.00 0.00 - 0.30 x10^3/uL   PRODUCT: FFP/PLASMA - UNITS : 1 Units   Result Value Ref Range    Coding System [id number]     UNIT NUMBER [id number]     BLOOD COMPONENT TYPE THAWED PLASMA     UNIT DIVISION 00     UNIT DISPENSE STATUS ALLOCATED     TRANSFUSION STATUS OK TO TRANSFUSE     Product Code [id number]        Radiology Tests (Please indicate ordered or reviewed)  Reviewed: XR CHEST AP    Result Date: 02/26/2022  Impression No focal alveolar infiltrate is identified. Radiologist location ID: [id number]     XR ELBOW RIGHT    Result Date: 02/26/2022  Impression No acute osseous abnormality. Radiologist location ID: [id number]     XR FEMUR RIGHT    Result Date: 02/26/2022  Impression Redemonstration of Craig right femoral proximal intertrochanteric fracture. Otherwise unremarkable right femur radiographs.  Radiologist location ID: [id number]     CT BRAIN WO IV CONTRAST    Result Date: 02/26/2022  Impression NO ACUTE FINDINGS One or more dose reduction techniques were used (e.g., Automated exposure control, adjustment of the mA and/or kV according to patient size, use of iterative reconstruction technique). Radiologist location ID: [id number]     XR HIPS BILATERAL W PELVIS 3-4 VIEWS    Result Date: 02/26/2022  Impression Intratrochanteric comminuted fracture of the right hip No evidence of left hip fracture. Radiologist location ID: [id number]       Problem List:  Active Hospital Problems   (*Primary Problem)    Diagnosis   . *Hip fracture (CMS HCC)   . Craig-fib (CMS HCC)   . Multiple myeloma (CMS HCC)         Assessment/ Plan:  The patient will be for surgical repair of right hip later today.  She is currently NPO.      She will be continued on rate controlling medications, and Eliquis will be restarted once okay with Orthopedic surgery.      Further interventions will be based on clinical course.  Hospitalist has examined the patient, as reviewed all material, agrees with the above medical management at this time.    DVT/PE Prophylaxis:      02/28/22  - given Vit K and 1 unit FFP -> scheduled for sgy this afternoon -> d/w Ortho -> will follow post-op    Chellie Vanlue Kerrin Champagne, MD

## 2022-02-28 NOTE — Anesthesia Postprocedure Evaluation (Signed)
Anesthesia Post Op Evaluation    Patient: Katelyn Craig  Procedure(s):  OPEN REDUCTION INTERNAL FIXATION RIGHT HIP USING LONG GAMMA NAIL    Last Vitals:Temperature: 36.1 C (97 F) (02/28/22 1950)  Heart Rate: (!) 109 (02/28/22 1950)  BP (Non-Invasive): (!) 128/94 (02/28/22 1950)  Respiratory Rate: 20 (02/28/22 1950)  SpO2: 95 % (02/28/22 1950)    No notable events documented.    Patient is sufficiently recovered from the effects of anesthesia to participate in the evaluation and has returned to their pre-procedure level.  Patient location during evaluation: PACU       Patient participation: complete - patient participated  Level of consciousness: awake and alert and responsive to verbal stimuli    Pain score: 5  Pain management: adequate  Airway patency: patent    Anesthetic complications: no  Cardiovascular status: acceptable  Respiratory status: acceptable  Hydration status: acceptable  Patient post-procedure temperature: Pt Normothermic   PONV Status: Absent

## 2022-02-28 NOTE — OR PostOp (Signed)
Patient in Afib with heart rate 110-150's. Anesthesia notified with new orders see mar.

## 2022-03-01 ENCOUNTER — Inpatient Hospital Stay (HOSPITAL_COMMUNITY): Payer: Medicare Other

## 2022-03-01 DIAGNOSIS — C9000 Multiple myeloma not having achieved remission: Secondary | ICD-10-CM

## 2022-03-01 DIAGNOSIS — Z7901 Long term (current) use of anticoagulants: Secondary | ICD-10-CM

## 2022-03-01 DIAGNOSIS — I4891 Unspecified atrial fibrillation: Secondary | ICD-10-CM

## 2022-03-01 DIAGNOSIS — S72001A Fracture of unspecified part of neck of right femur, initial encounter for closed fracture: Secondary | ICD-10-CM

## 2022-03-01 LAB — BASIC METABOLIC PANEL
ANION GAP: 9 mmol/L — ABNORMAL LOW (ref 10–20)
BUN/CREA RATIO: 26 — ABNORMAL HIGH (ref 6–22)
BUN: 20 mg/dL (ref 7–25)
CALCIUM: 8 mg/dL — ABNORMAL LOW (ref 8.6–10.3)
CHLORIDE: 103 mmol/L (ref 98–107)
CO2 TOTAL: 27 mmol/L (ref 21–31)
CREATININE: 0.76 mg/dL (ref 0.60–1.30)
ESTIMATED GFR: 80 mL/min/{1.73_m2} (ref >59)
GLUCOSE: 118 mg/dL — ABNORMAL HIGH (ref 74–109)
OSMOLALITY, CALCULATED: 281 mOsm/kg (ref 270–290)
POTASSIUM: 3.6 mmol/L (ref 3.5–5.1)
SODIUM: 139 mmol/L (ref 136–145)

## 2022-03-01 LAB — CBC WITH DIFF
BASOPHIL #: 0 10*3/uL (ref 0.00–0.30)
BASOPHIL %: 1 % (ref 0–3)
EOSINOPHIL #: 0 10*3/uL (ref 0.00–0.80)
EOSINOPHIL %: 0 % (ref 0–7)
HCT: 29.3 % — ABNORMAL LOW (ref 37.0–47.0)
HGB: 10.3 g/dL — ABNORMAL LOW (ref 12.5–16.0)
LYMPHOCYTE #: 0.8 10*3/uL — ABNORMAL LOW (ref 1.10–5.00)
LYMPHOCYTE %: 15 % — ABNORMAL LOW (ref 25–45)
MCH: 31 pg (ref 27.0–32.0)
MCHC: 35.1 g/dL (ref 32.0–36.0)
MCV: 88.4 fL (ref 78.0–99.0)
MONOCYTE #: 1 10*3/uL (ref 0.00–1.30)
MONOCYTE %: 19 % — ABNORMAL HIGH (ref 0–12)
MPV: 9.4 fL (ref 7.4–10.4)
NEUTROPHIL #: 3.5 10*3/uL (ref 1.80–8.40)
NEUTROPHIL %: 66 % (ref 40–76)
PLATELETS: 167 10*3/uL (ref 140–440)
RBC: 3.31 10*6/uL — ABNORMAL LOW (ref 4.20–5.40)
RDW: 15.7 % — ABNORMAL HIGH (ref 11.6–14.8)
WBC: 5.4 10*3/uL (ref 4.0–10.5)
WBCS UNCORRECTED: 5.4 10*3/uL

## 2022-03-01 LAB — PRODUCT: FFP/PLASMA - UNITS: UNIT DIVISION: 0

## 2022-03-01 LAB — MAGNESIUM: MAGNESIUM: 2 mg/dL (ref 1.9–2.7)

## 2022-03-01 MED ORDER — SODIUM CHLORIDE 0.9 % INTRAVENOUS SOLUTION
2.0000 g | Freq: Three times a day (TID) | INTRAVENOUS | Status: AC
Start: 2022-03-01 — End: 2022-03-01
  Administered 2022-03-01: 2 g via INTRAVENOUS
  Administered 2022-03-01: 0 g via INTRAVENOUS
  Administered 2022-03-01: 2 g via INTRAVENOUS
  Administered 2022-03-01: 0 g via INTRAVENOUS
  Filled 2022-03-01 (×2): qty 20

## 2022-03-01 MED ORDER — METOPROLOL SUCCINATE ER 50 MG TABLET,EXTENDED RELEASE 24 HR
50.0000 mg | ORAL_TABLET | Freq: Once | ORAL | Status: AC
Start: 2022-03-01 — End: 2022-03-01
  Administered 2022-03-01: 50 mg via ORAL
  Filled 2022-03-01: qty 1

## 2022-03-01 MED ORDER — DEXTROSE 5 % IN WATER (D5W) INTRAVENOUS SOLUTION
2.0000 g | Freq: Three times a day (TID) | INTRAVENOUS | Status: DC
Start: 2022-03-01 — End: 2022-03-01
  Filled 2022-03-01 (×2): qty 20

## 2022-03-01 MED ORDER — METOPROLOL SUCCINATE ER 50 MG TABLET,EXTENDED RELEASE 24 HR
50.0000 mg | ORAL_TABLET | Freq: Two times a day (BID) | ORAL | Status: DC
Start: 2022-03-01 — End: 2022-03-03
  Administered 2022-03-01 – 2022-03-03 (×4): 50 mg via ORAL
  Filled 2022-03-01 (×4): qty 1

## 2022-03-01 NOTE — PT Evaluation (Signed)
[facility]  [place], [address]  [phone]  [phone]  Rehabilitation Services  Physical Therapy Inpatient Initial Evaluation    Patient Name: Katelyn Craig  Date of Birth: 12/06/1940  Height: Height: 172.7 cm (5' 8" )  Weight: Weight: 67 kg (147 lb 11.2 oz)  Room/Bed: 367/A  Payor: MEDICARE / Plan: MEDICARE PART A AND B / Product Type: Medicare /       PMH:  Past Medical History:   Diagnosis Date    Cancer (CMS HCC)     HTN (hypertension)     Multiple myeloma (CMS HCC)            Assessment:      (P) Seen by PT [id number], [id number].  Post-op ORIF Rt hip; pain; decreased independence/sfaety in functional moblity and amb; limited endurance with tachycardia this session.  Requires PT this adm to improve RLE function and reach highest functional potential possible for discharge.    Discharge Needs:    Equipment Recommendation: (P) front wheeled walker, shower chair, raised toilet seat, bedside commode      The patient presents with mobility limitations due to impaired balance, impaired range of motion, impaired strength, impaired functional activity tolerance, and pain  that significantly impair/prevent patient's ability to participate in mobility-related activities of daily living (MRADLs) including  ambulation and transfers in order to safely complete, toileting, bathing, safely entering/exiting the home. This functional mobility deficit can be sufficiently resolved with the use of a (P) front wheeled walker, shower chair, raised toilet seat, bedside commode  in order to decrease the risk of falls, morbidity, and mortality in performance of these MRADLs.  Patient is able to safely use this assistive device.    Discharge Disposition: (P) inpatient rehabilitation facility, skilled nursing facility    JUSTIFICATION OF DISCHARGE RECOMMENDATION   Based on current diagnosis, functional performance prior to admission, and current functional performance,  this patient requires continued PT services in (P) inpatient rehabilitation facility, skilled nursing facility in order to achieve significant functional improvements in these deficit areas: (P) aerobic capacity/endurance, gait, locomotion, and balance, joint integrity and mobility, ROM (range of motion).        Plan:   Current Intervention: (P) bed mobility training, gait training, patient/family education, ROM (range of motion), strengthening, transfer training  To provide physical therapy services (P)  (1-3x/day M-Saturday)  for duration of (P) until discharge.    The risks/benefits of therapy have been discussed with the patient/caregiver and he/she is in agreement with the established plan of care.       Subjective & Objective     Past Medical History:   Diagnosis Date    Cancer (CMS HCC)     HTN (hypertension)     Multiple myeloma (CMS HCC)             Past Surgical History:   Procedure Laterality Date    HX BACK SURGERY                   03/01/22 1546   Rehab Session   Document Type evaluation   Total PT Minutes: 40   Patient Effort adequate   Patient Effort, Rehab Treatment Comment limited by fatique and elevated HR   Symptoms Noted During/After Treatment significant change in vital signs;fatigue   Symptoms Noted Comment tachycardia   General Information   Patient Profile Reviewed yes   Onset of Illness/Injury or Date of Surgery 02/26/22  Patient/Family/Caregiver Comments/Observations c/o Rt shoulder pain and requesting x-ray prior to PT   Pertinent History of Current Functional Problem adm. follwoing fall, Rt femur fx, Hypokalemia, AF with RVR, anemia, UTI.  surgery 02/28/22: ORIF Rt femur; order for no WB limitations.  hx of MM.  Rt shld x-ray (-).   Medical Lines Foley Catheter;PIV Line;Telemetry  (pulse oximeter)   Respiratory Status nasal cannula   Existing Precautions/Restrictions fall precautions   General Observations of Patient cleared for PT per nursing, pt receptive this pm; vitals stable prior to  PT, tachycardia during session to 180s per nursing.   Weight-bearing Status   Extremity Weight-bearing Status right lower extremity   Right Lower Extremity weight-bearing as tolerated (WBAT)   Mutuality/Individual Preferences   Anxieties, Fears or Concerns fx bones in light of MM   Individualized Care Needs fall prevention   Patient-Specific Goals (Include Timeframe) to be able to return home, be walking   Plan of Care Reviewed With patient;family   Living Environment   Lives With spouse  (daughter there at times)   Living Arrangements mobile home   Home Assessment: No Problems Identified   Home Accessibility stairs to enter home;bed and bath on same level;tub/shower is not walk in   Stairs Within Home, Primary   Number of Stairs, Within Home, Primary one  (small step toward the back of the home)   Home Main Entrance   Stair Railings, Main Entrance railings on both sides of stairs   Stairs Comment, Main Entrance 3 steps then landing then 3 steps   Landing, Stairs, Main Entrance adequate turning radius   Number of Stairs, Main Entrance six   Functional Level Prior   Ambulation 0 - independent   Transferring 0 - independent   Toileting 0 - independent   Bathing 1 - assistive equipment   Dressing 0 - independent   Eating 0 - independent   Communication 0 - understands/communicates without difficulty   Prior Functional Level Comment shower chair in tub, has BSC, has FWW, no O2 used   Self-Care   Dominant Hand left   Pre Treatment Status   Pre Treatment Patient Status Patient supine in bed;Call light within reach;Nurse approved session   Support Present Pre Treatment  Family present   Communication Pre Treatment  Nurse;Charge Nurse   Communication Pre Treatment Comment cleared for PT   Cognitive Assessment/Interventions   Behavior/Mood Observations behavior appropriate to situation, WNL/WFL   Orientation Status oriented x 3   Attention WNL/WFL   Follows Commands WNL   Vital Signs   Pre-Treatment Heart Rate (beats/min)  111   Post-treatment Heart Rate (beats/min) 116   Pre SpO2 (%) 92   O2 Delivery Pre Treatment supplemental O2   Post SpO2 (%) 88   O2 Delivery Post Treatment room air   Vitals Comment HR into 180s during mobility, improved with seated rest on BSC but increase again during transfer to bed.  o2 reapplied   Pain Assessment   Additional Documentation Numbers Pain Scale, Pre/Post Treatment (Group)   Pretreatment Pain Rating 3/10   Posttreatment Pain Rating 7/10   Pain Location - Side Right   Pain Location hip   RUE Assessment   RUE Assessment X- Exceptions   RUE ROM WFL except shld approx. 30 deg active flex due to pain   RUE Strength WFL except shld flex 3-/5   LUE Assessment   LUE Assessment WFL for Age   RLE Assessment   RLE Assessment X-Exceptions   RLE  ROM ankleWFL, knee WFL, hip limited by pain but able to sit EOB and BSC   RLE Strength at least 4-/5 knee ext and ankle DF; hip not assessed fully   LLE Assessment   LLE Assessment WFL for age   Trunk Assessment   Trunk Assessment WFL for age  (has "rods" in back; some difficulty maintaining upright in standing today)   Bed Mobility Assessment/Treatment   Bed Mobility, Assistive Device draw sheet   Supine-Sit Independence moderate assist (50% patient effort);2 person assist required;verbal cues required   Sit to Supine, Independence maximum assist (25% patient effort);2 person assist required   Safety Issues decreased use of arms for pushing/pulling;decreased use of legs for bridging/pushing   Impairments pain;ROM decreased;strength decreased   Comment able to assist bridge/scoot laterally   Transfer Assessment/Treatment   Sit-Stand Independence moderate assist (50% patient effort);verbal cues required   Stand-Sit Independence moderate assist (50% patient effort);verbal cues required   Sit-Stand-Sit, Assist Device walker, front wheeled   Toilet Transfer Independence moderate assist (50% patient effort);verbal cues required   Toilet Transfer Assist Device walker, front  wheeled   Transfer Safety Issues weight-shifting ability decreased;loses balance backward   Transfer Impairments pain;ROM decreased;strength decreased;endurance   Gait Assessment/Treatment   Total Distance Ambulated 14   Independence  minimum assist (75% patient effort);2 person assist required;verbal cues required   Assistive Device  walker, front wheeled   Distance in Feet 14   Gait Speed slow   Deviations  toe-to-floor clearance decreased;weight-shifting ability decreased;step length decreased   Safety Issues  loses balance backward;step length decreased   Impairments  balance impaired;endurance;pain;ROM decreased;strength decreased   Comment narrow BOS at times, difficulty advancing RLE at times, repetitive cues for UE support on FWW; very slow pace   Balance Skill Training   Sitting Balance: Static fair - balance   Sitting, Dynamic (Balance) fair - balance   Sit-to-Stand Balance poor + balance   Standing Balance: Static fair - balance   Standing Balance: Dynamic poor + balance   Post Treatment Status   Post Treatment Patient Status Patient supine in bed;Call light within reach  (bed check)   Support Present Post Treatment  Family present;Nurse present   Communication Post Treatement Nurse   Plan of Care Review   Plan Of Care Reviewed With patient;family   Functional Impairment   Overall Functional Impairments/Problem List balance impaired;endurance;pain;ROM decreased;strength decreased   Physical Therapy Clinical Impression   Assessment Seen by PT [id number], [id number].  Post-op ORIF Rt hip; pain; decreased independence/sfaety in functional moblity and amb; limited endurance with tachycardia this session.  Requires PT this adm to improve RLE function and reach highest functional potential possible for discharge.   Criteria for Skilled Therapeutic skilled treatment is necessary   Pathology/Pathophysiology Noted musculoskeletal;cardiovascular   Impairments Found (describe specific impairments) aerobic  capacity/endurance;gait, locomotion, and balance;joint integrity and mobility;ROM (range of motion)   Functional Limitations in Following  self-care   Rehab Potential fair   Therapy Frequency   (1-3x/day M-Saturday)   Predicted Duration of Therapy Intervention (days/wks) until discharge   Anticipated Equipment Needs at Discharge (PT) front wheeled walker;shower chair;raised toilet seat;bedside commode   Anticipated Discharge Disposition inpatient rehabilitation facility;skilled nursing facility   Referral Needed to Another Service occupational therapy   Evaluation Complexity Justification   Patient History: Co-morbidity/factors that impact Plan of Care Surgical procedure: causing pain &/or impaired function;Fracture: cause pain &/or impaired function   Examination Components Vital signs (BP, HR, RR, or O2 saturation);Range of  motion;Strength;Balance;Bed mobility;Transfers;Ambulation   Presentation Evolving: Symptoms, complaints, characteristics of condition changing &/or cognitive deficits present   Clinical Decision Making Moderate complexity   Evaluation Complexity Moderate complexity   Care Plan Goals   PT Rehab Goals Bed Mobility Goal;Gait Training Goal;Transfer Training Goal   Bed Mobility Goal   Bed Mobility Goal, Date Established 03/01/22   Bed Mobility Goal, Time to Achieve by discharge   Bed Mobility Goal, Activity Type scoot/bridge;supine to sit/sit to supine   Bed Mobility Goal, Independence Level minimum assist (75% patient effort);verbal cues required   Gait Training  Goal, Distance to Achieve   Gait Training  Goal, Date Established 03/01/22   Gait Training  Goal, Time to Achieve by discharge   Gait Training  Goal, Independence Level minimum assist (75% patient effort);verbal cues required   Gait Training  Goal, Assist Device walker, rolling   Gait Training  Goal, Distance to Achieve 50   Transfer Training Goal   Transfer Training Goal, Date Established 03/01/22   Transfer Training Goal, Time to Achieve  by discharge   Transfer Training Goal, Activity Type bed-to-chair/chair-to-bed;sit-to-stand/stand-to-sit   Transfer Training Goal, Independence Level verbal cues required;minimum assist (75% patient effort)   Transfer Training Goal, Assist Device walker, rolling   Planned Therapy Interventions, PT Eval   Planned Therapy Interventions (PT) bed mobility training;gait training;patient/family education;ROM (range of motion);strengthening;transfer training   (INSERT FLOWSHEET)            INTERVENTION MINUTES: EVALUATION 20 minutes and THERAPEUTIC ACTIVITY 20 minutes    EVALUATION COMPLEXITY : CLINICAL DECISION MAKING OF MODERATE COMPLEXITY AS INDICATED BY PMHX, PHYSICAL THERAPY ASSESSMENT OF MUSCULOSKELETAL AND NEUROLOGICAL SYSTEMS AND ACTIVITY LIMITATIONS. CLINICAL PRESENTATION HAS CHANGING CHARACTERISTICS.    Therapist:     Vonna Kotyk, PT  03/01/2022, 16:51

## 2022-03-01 NOTE — Progress Notes (Signed)
[facility]   IP PROGRESS NOTE      Katelyn, Ojala Craig  Date of Admission:  02/26/2022  Date of Birth:  15-Mar-1941  Date of Service:  03/01/2022    Hospital Day:  LOS: 3 days         HPI:  Katelyn Craig is Craig 81 y.o. female admitted with right hip fracture, AFib RVR.  Heart rate is better controlled today.  She is for surgical repair later on today. Eliquis will be restarted after surgery once okay with Orthopedic surgeon.     S. Pt. Has no c/o, family and nursing staff are bedside.     Review of Systems:  General: No fever or chills. No weight changes, fatigue, weakness.   HEENT: No headaches, dizziness, changes in vision, changes in hearing, or difficulty swallowing.    Skin:  No rashes, erythema or bruises.   Cardiac: No chest pain, palpitations, or arrhythmia.    Respiratory: No shortness of breath, cough, or wheezing.  GI: No nausea or vomiting. No abdominal pain.   Urinary: No dysuria, hematuria, or change in frequency.    Vascular: No edema.     Musculoskeletal:  Persistent pain right hip   Neurologic: No loss of sensation, numbness or tingling.   Endocrine: No heat or cold intolerance or polydipsia.   Psychiatric: No insomnia, depression or anxiety.    Physical Examination  General: Patient is alert and oriented to person, place, and time. No acute distress. Communicates appropriately.   Head: Normocephalic and atraumatic.    Eyes: Pupils equally round and react to light and accommodate. Extraocular movements intact.  Conjunctiva normal. Sclerae are normal.    Nose: Nasal passages clear. Mucosa moist.    Throat: Moist oral mucosa. No erythema or exudate of the pharynx. Clear oropharynx.    Neck: Supple. No cervical lymphadenopathy or supraclavicular nodes detected. Trachea midline   Heart:  Irregularly irregular, no murmur heart rate control   Lungs: Clear to auscultation bilaterally with no wheezes or rales. Equal chest excursion.  No conversational dyspnea. No respiratory distress noted.    Abdomen: Soft, nontender, nondistended belly. Bowel sounds are present in all four quadrants. No rigidity.  No guarding.  No ascites.   Extremities: No edema, cyanosis, or clubbing.    Skin: Warm and dry without lesions. No ecchymosis noted.    Neurologic: Cranial nerves II through XII are grossly intact. Sensation to light touch is intact. Strength 5/5 in upper extremities and lower extremities bilaterally.    Genitourinary:  No urinary incontinence or Foley catheter   Psychiatric: Judgment and insight are intact. Mood and affect are appropriate for the situation.      Vital Signs:  Temp (24hrs) Max:37.3 C (99.2 F)      Temperature: 37.3 C (99.2 F)  BP (Non-Invasive): (!) 161/82  MAP (Non-Invasive): 80 mmHG  Heart Rate: (!) 105  Respiratory Rate: 16  SpO2: 94 %    Current Medications:  alcohol 62 % (NOZIN NASAL SANITIZER) nasal solution, 1 Each, Each Nostril, 2x/day  apixaban (ELIQUIS) tablet, 2.5 mg, Oral, 2x/day  bisacodyl (DULCOLAX) rectal suppository, 10 mg, Rectal, Daily PRN  ceFAZolin (ANCEF) 2 g in D5W 50 mL IVPB, 2 g, Intravenous, Q8H  docusate sodium (COLACE) capsule, 100 mg, Oral, 2x/day  [Held by provider] fentaNYL (DURAGESIC) patch 50 mcg/hr, 1 Patch, Transdermal, Q72H  guaiFENesin (MUCINEX) extended release tablet - for cough (expectorant), 600 mg, Oral, 2x/day  HYDROcodone-acetaminophen (NORCO) 10-325 mg per tablet, 1 Tablet,  Oral, Q6H PRN  HYDROmorphone (DILAUDID) 2 mg/mL injection, 1.2 mg, Intravenous, Q3H PRN  levoFLOXacin (LEVAQUIN) tablet 750 mg, 750 mg, Oral, Daily  LORazepam (ATIVAN) tablet, 0.5 mg, Oral, Q6H PRN  LR premix infusion, , Intravenous, Continuous  LR premix infusion, , Intravenous, Continuous  metoprolol succinate (TOPROL-XL) 24 hr extended release tablet, 50 mg, Oral, 2x/day  NS flush syringe, 3 mL, Intracatheter, Q8HRS  NS flush syringe, 3 mL, Intracatheter, Q1H PRN  ondansetron (ZOFRAN) 2 mg/mL injection, 4 mg, Intravenous, Q8H PRN  polyethylene glycol (MIRALAX) oral  packet, 17 g, Oral, Daily  sennosides-docusate sodium (SENOKOT-S) 8.6-50mg per tablet, 1 Tablet, Oral, 2x/day PRN  valsartan (DIOVAN) tablet, 80 mg, Oral, 2x/day        Current Orders:  Active Orders   Lab    BASIC METABOLIC PANEL - AM ONCE     Frequency: 0530 - AM DRAW     Number of Occurrences: 1 Occurrences    CBC/DIFF - AM ONCE     Frequency: 0530 - AM DRAW     Number of Occurrences: 1 Occurrences   Diet    DIET REGULAR Do you want to initiate MNT Protocol? Yes     Frequency: All Meals     Number of Occurrences: 1 Occurrences   Nursing    ACTIVITY Activity: TO CHAIR; Instructions: OTHER (SPECIFY IN COMMENTS); Other: OOB & UP TO CHAIR IF OUT OF SURGERY BY 3PM; Weight bearing limits: NO WEIGHT-BEARING LIMITATIONS     Frequency: UNTIL DISCONTINUED     Number of Occurrences: Until Specified    APIXABAN NURSING ORDER     Frequency: UNTIL DISCONTINUED     Number of Occurrences: Until Specified     Order Comments: Nursing Instructions:    1.  Print apixaban (Eliquis) guide using the link in this order.   2.  Nurse to provide apixaban patient guide to all patients and be sure that all new starts have received education by the trained anticoagulation educator.  If additional questions, contact pharmacy.           APPLY SEQUENTIAL COMPRESSION DEVICE     Frequency: ONE TIME     Number of Occurrences: 1 Occurrences    BLADDER SCAN IF PATIENT HAS NOT VOIDED WITHIN 4 HOURS     Frequency: Q4H PRN     Number of Occurrences: Until Specified    BLADDER SCAN PRN IF NO VOID     Frequency: PRN     Number of Occurrences: 1 Occurrences     Order Comments: If scanned urine volume greater than 300 ML, straight cath and call MD of urine cath volume. (enter secondary order for straight cath x1).  If scanned urine volume less than 300 ML rescan in 2 hours and notify MD of scanned volume.      CATHETERIZE WITH Craig FOLEY CATH, IF > 750ML LEAVE FOLEY IN FOR 24 HOURS.  DISCONTINUE FOLEY CATEHTER 24 HRS POST-OP UNLESS CONTAINDICATED DUE TO  MEDICAL NECESSITY     Frequency: Q6H PRN     Number of Occurrences: Until Specified    CONTINUOUS CARDIAC MONITORING (ED USE ONLY)     Frequency: ONE TIME     Number of Occurrences: 1 Occurrences    DECUBITUS PRECAUTIONS: CHECK SACRUM, HEELS, OCCIPUT AND ELBOWS REGULARLY. PAD ANKLES, ELBOWS, KNEES AND HEELS.  TURN PATIENT IN BED.     Frequency: UNTIL SPECIFIED     Number of Occurrences: Until Specified    FOLEY CATHETER INSERT & MAINTAIN  Frequency: UNTIL DISCONTINUED     Number of Occurrences: Until Specified    HEEL PROTECTORS     Frequency: CONTINUOUS     Number of Occurrences: Until Specified    ICE TO AFFECTED AREA     Frequency: PRN     Number of Occurrences: Until Specified    INCENTIVE SPIROMETRY NURSING     Frequency: Q1H WA     Number of Occurrences: 250 Occurrences    INCENTIVE SPIROMETRY NURSING     Frequency: Q1H WA     Number of Occurrences: 250 Occurrences    INDWELLING URINARY CATHETER CARE QSHIFT     Frequency: QSHIFT     Number of Occurrences: Until Specified    INTAKE AND OUTPUT Q8H     Frequency: Q8H     Number of Occurrences: Until Specified    INTAKE AND OUTPUT QSHIFT     Frequency: QSHIFT     Number of Occurrences: Until Specified    MAINTAIN SEQUENTIAL COMPRESSION DEVICE     Frequency: CONTINUOUS X 72 HRS     Number of Occurrences: 72 Hours     Order Comments: To LLE only      MISCELLANEOUS MD/DO TO NURSE     Frequency: UNTIL DISCONTINUED     Number of Occurrences: Until Specified     Order Comments: Pls complete meds hx so Admission MedRec can be completed.  Thx.      NOTIFY MD     Frequency: PRN     Number of Occurrences: Until Specified    Notify MD Vital Signs     Frequency: PRN     Number of Occurrences: Until Specified    Notify MD Vital Signs     Frequency: PRN     Number of Occurrences: Until Specified     Order Comments: Notify MD if urine output is less than 500 mL 8 hours after surgery.      NURSING DIET INFORMATION (NOT Craig DIET ORDER) - ADVANCE DIET AS TOLERATED.  NURSING TO  ENTER SECONDARY ORDER FOR ACUTAL DIET ORDER.     Frequency: UNTIL DISCONTINUED     Number of Occurrences: Until Specified     Order Comments: If instructions require Craig change in diet, nursing needs to enter appropriate secondary diet order.      PT IS HIGH RISK FOR VENOUS THROMBOEMBOLISM     Frequency: CONTINUOUS     Number of Occurrences: Until Specified    PT IS HIGH RISK FOR VENOUS THROMBOEMBOLISM     Frequency: CONTINUOUS     Number of Occurrences: Until Specified    PULSE OXIMETRY Q4H     Frequency: Q4H     Number of Occurrences: Until Specified    REINFORCE DRESSING     Frequency: PRN     Number of Occurrences: Until Specified    TELEMETRY MONITORING - Continuous     Frequency: CONTINUOUS     Number of Occurrences: Until Specified    TRAPEZE TO BED     Frequency: CONTINUOUS     Number of Occurrences: Until Specified    VITAL SIGNS MULTI FREQUENCY     Frequency: UNTIL DISCONTINUED     Number of Occurrences: Until Specified    VITAL SIGNS Q4H     Frequency: Q4H     Number of Occurrences: Until Specified    VITAL SIGNS QSHIFT     Frequency: QSHIFT     Number of Occurrences: Until Specified    WAS PATIENT ON  APIXABAN PRIOR TO ADMISSION?     Frequency: UNTIL DISCONTINUED     Number of Occurrences: Until Specified   Code Status    FULL CODE     Frequency: CONTINUOUS     Number of Occurrences: Until Specified   Consult    IP CONSULT TO CARDIOLOGY Requested Provider; Anderson Malta Craig     Frequency: ONE TIME     Number of Occurrences: 1 Occurrences     Order Comments: Afib RVR,  pre op cardiac risk  stratification      IP CONSULT TO CARE MANAGEMENT     Frequency: ONE TIME     Number of Occurrences: 1 Occurrences    IP CONSULT TO ORTHOPEDICS Requested Provider; Mervyn Skeeters     Frequency: ONE TIME     Number of Occurrences: 1 Occurrences   PT    PT EVALUATE AND TREAT     Frequency: Per Therapist Discretion     Number of Occurrences: 1 Occurrences     Scheduling Instructions:               Respiratory Care    INCENTIVE  SPIROMETRY - RT INSTRUCT     Frequency: ONE TIME     Number of Occurrences: 1 Occurrences    INCENTIVE SPIROMETRY - RT INSTRUCT     Frequency: ONE TIME     Number of Occurrences: 1 Occurrences    OXYGEN - NASAL CANNULA     Frequency: PRN     Number of Occurrences: Until Specified     Order Comments: Flowrate should not exeed 6L/min except [facility] facility.           OXYGEN - NASAL CANNULA     Frequency: PRN     Number of Occurrences: Until Specified     Order Comments: Flowrate should not exeed 6L/min except [facility] facility.           OXYGEN WEANING PARAMETERS     Frequency: UNTIL DISCONTINUED     Number of Occurrences: Until Specified    PULSE OXIMETRY - CONTINUOUS     Frequency: CONTINUOUS     Number of Occurrences: Until Specified   Point of Care Testing    PERFORM POC WHOLE BLOOD GLUCOSE ONCE IF PATIENT DIABETIC     Frequency: PRN     Number of Occurrences: Until Specified   IV    INSERT & MAINTAIN PERIPHERAL IV ACCESS     Frequency: UNTIL DISCONTINUED     Number of Occurrences: Until Specified    PERIPHERAL IV DRESSING CHANGE     Frequency: PRN     Number of Occurrences: Until Specified   Medications    alcohol 62 % (NOZIN NASAL SANITIZER) nasal solution     Frequency: 2x/day     Dose: 1 Each     Route: Each Nostril    apixaban (ELIQUIS) tablet     Frequency: 2x/day     Dose: 2.5 mg     Route: Oral    bisacodyl (DULCOLAX) rectal suppository     Frequency: Daily PRN     Dose: 10 mg     Route: Rectal    ceFAZolin (ANCEF) 2 g in D5W 50 mL IVPB     Frequency: Q8H     Dose: 2 g     Route: Intravenous    docusate sodium (COLACE) capsule     Frequency: 2x/day     Dose: 100 mg     Route: Oral  fentaNYL (DURAGESIC) patch 50 mcg/hr     Frequency: Q72H     Dose: 1 Patch     Route: Transdermal    guaiFENesin (MUCINEX) extended release tablet - for cough (expectorant)     Frequency: 2x/day     Dose: 600 mg     Route: Oral    HYDROcodone-acetaminophen (NORCO) 10-325 mg per tablet     Frequency: Q6H PRN     Dose: 1 Tablet      Route: Oral    HYDROmorphone (DILAUDID) 2 mg/mL injection     Frequency: Q3H PRN     Dose: 1.2 mg     Route: Intravenous    levoFLOXacin (LEVAQUIN) tablet 750 mg     Frequency: Daily     Dose: 750 mg     Route: Oral    LORazepam (ATIVAN) tablet     Frequency: Q6H PRN     Dose: 0.5 mg     Route: Oral    LR premix infusion     Frequency: Continuous     Route: Intravenous    LR premix infusion     Frequency: Continuous     Route: Intravenous    metoprolol succinate (TOPROL-XL) 24 hr extended release tablet     Frequency: 2x/day     Dose: 50 mg     Route: Oral    NS flush syringe     Frequency: Q8HRS     Dose: 3 mL     Route: Intracatheter    NS flush syringe     Frequency: Q1H PRN     Dose: 3 mL     Route: Intracatheter    ondansetron (ZOFRAN) 2 mg/mL injection     Frequency: Q8H PRN     Dose: 4 mg     Route: Intravenous    polyethylene glycol (MIRALAX) oral packet     Frequency: Daily     Dose: 17 g     Route: Oral    potassium chloride (K-DUR) extended release tablet     Frequency: 3x/day     Dose: 40 mEq     Route: Oral    sennosides-docusate sodium (SENOKOT-S) 8.6-50mg per tablet     Frequency: 2x/day PRN     Dose: 1 Tablet     Route: Oral    valsartan (DIOVAN) tablet     Frequency: 2x/day     Dose: 80 mg     Route: Oral          I/O:  I/O last 24 hours:      Intake/Output Summary (Last 24 hours) at 03/01/2022 0935  Last data filed at 03/01/2022 0600  Gross per 24 hour   Intake 1590.5 ml   Output 1350 ml   Net 240.5 ml     I/O current shift:  No intake/output data recorded.      Labs (Please indicate ordered or reviewed)  Reviewed: Lab Results Today:   Results for orders placed or performed during the hospital encounter of 02/26/22 (from the past 24 hour(s))   BASIC METABOLIC PANEL - AM ONCE   Result Value Ref Range    SODIUM 139 136 - 145 mmol/L    POTASSIUM 3.6 3.5 - 5.1 mmol/L    CHLORIDE 103 98 - 107 mmol/L    CO2 TOTAL 27 21 - 31 mmol/L    ANION GAP 9 (L) 10 - 20 mmol/L    CALCIUM 8.0 (L) 8.6 - 10.3 mg/dL     GLUCOSE 118 (  H) 74 - 109 mg/dL    BUN 20 7 - 25 mg/dL    CREATININE 0.76 0.60 - 1.30 mg/dL    BUN/CREA RATIO 26 (H) 6 - 22    ESTIMATED GFR 80 >59 mL/min/1.74m2    OSMOLALITY, CALCULATED 281 270 - 290 mOsm/kg   MAGNESIUM - AM ONCE   Result Value Ref Range    MAGNESIUM 2.0 1.9 - 2.7 mg/dL   CBC WITH DIFF   Result Value Ref Range    WBCS UNCORRECTED 5.4 x10^3/uL    WBC 5.4 4.0 - 10.5 x10^3/uL    RBC 3.31 (L) 4.20 - 5.40 x10^6/uL    HGB 10.3 (L) 12.5 - 16.0 g/dL    HCT 29.3 (L) 37.0 - 47.0 %    MCV 88.4 78.0 - 99.0 fL    MCH 31.0 27.0 - 32.0 pg    MCHC 35.1 32.0 - 36.0 g/dL    RDW 15.7 (H) 11.6 - 14.8 %    PLATELETS 167 140 - 440 x10^3/uL    MPV 9.4 7.4 - 10.4 fL    NEUTROPHIL % 66 40 - 76 %    LYMPHOCYTE % 15 (L) 25 - 45 %    MONOCYTE % 19 (H) 0 - 12 %    EOSINOPHIL % 0 0 - 7 %    BASOPHIL % 1 0 - 3 %    NEUTROPHIL # 3.50 1.80 - 8.40 x10^3/uL    LYMPHOCYTE # 0.80 (L) 1.10 - 5.00 x10^3/uL    MONOCYTE # 1.00 0.00 - 1.30 x10^3/uL    EOSINOPHIL # 0.00 0.00 - 0.80 x10^3/uL    BASOPHIL # 0.00 0.00 - 0.30 x10^3/uL       Radiology Tests (Please indicate ordered or reviewed)  Reviewed: No results found.    Problem List:  Active Hospital Problems   (*Primary Problem)    Diagnosis   . *Hip fracture (CMS HCC)   . Craig-fib (CMS HCC)   . Multiple myeloma (CMS HCC)     POD 1 ORIF r. Hip w/ long cephalomedullary nail    Assessment/ Plan:    She will be continued on rate controlling medications, Eliquis  restarted     Further interventions will be based on clinical course.  Hospitalist has examined the patient, as reviewed all material, agrees with the above medical management at this time.    DVT/PE Prophylaxis:      02/28/22  - given Vit K and 1 unit FFP -> scheduled for sgy this afternoon -> d/w Ortho -> will follow post-op    03/01/22  - Metoprolol 50 mg increased to bid dosing per Cards  - per CM bed available at [facility]  - final disposition per Ortho    CColon Branch MD

## 2022-03-01 NOTE — Progress Notes (Signed)
ORTHOPAEDIC SURGERY PROGRESS NOTE    Patient Name: Katelyn Craig   MRN: [id number]   Date of Birth: 12-13-1940  Date: 03/01/2022  Age: 81 y.o.    Gender: female  Attending Physician: Salvatore Marvel, MD    Subjective:    HPI:  Katelyn Craig is a 81 y.o. female who is status post ORIF right hip-date of surgery 02/28/2022    Patient seen evaluated on rounds today.  Doing well.  Worked with physical therapy.  No acute overnight events.  Pain control during exam.  Denies paralysis or paresthesias.    Objective:    Last Filed Vitals:  Filed Vitals:    03/01/22 1222 03/01/22 1442 03/01/22 1624 03/01/22 1908   BP:    117/66   Pulse: (!) 105  (!) 180 100   Resp:  16  18   Temp:    36.7 C (98.1 F)   SpO2:    95%        Musculoskeletal Exam:     Examination of the operative extremity demontrates:    Surgical dressings clean dry and intact with no evidence of strike through drainage.  Appropriately tender palpation about the hip.  Motor and sensation grossly intact.  Compartments soft and compressible.  Negative Homan or evidence of DVT.  Foot warm and well perfused.    Radiographs:  Results for orders placed or performed during the hospital encounter of 02/26/22 (from the past 72 hour(s))   FLUORO LOWER EXTREMITY IN OR RIGHT     Status: None    Narrative    Katelyn Craig      PROCEDURE DESCRIPTION: XR HIP    CLINICAL HISTORY: ORIF RIGHT HIP USING LONG GAMMA NAIL    COMPARISON:02/26/2022          FINDINGS: 5 views of the right hip were obtained. The patient is status post ORIF of previously described intertrochanteric fracture of the proximal right femur. A long orthopedic fixation nail is identified traversing the fracture site. Please see the operative note for further detail.        Impression    Interval ORIF of the previously noted intertrochanteric fracture of the proximal right femur is seen.        Radiologist location ID: [id number]     XR SHOULDER RIGHT     Status: None    Narrative    Katelyn A  Craig    RADIOLOGIST: Brayton El, MD    XR SHOULDER RIGHT performed on 03/01/2022 2:41 PM    CLINICAL HISTORY: pain upon movement.       right shoulder pain upon movement    TECHNIQUE: 3 view(s) of the right shoulder    COMPARISON:  None.    FINDINGS:   No fracture.  No suspicious bone lesion.  Normal alignment of the acromioclavicular and glenohumeral joints.  Mild to moderate osteoarthrosis of the glenohumeral and joints.  Soft tissues are unremarkable.        Impression    DEGENERATIVE OSTEOARTHROSIS. NO ACUTE FINDINGS.           Radiologist location ID: [id number]          Lab Data   Lab Results   Component Value Date    WBC 5.4 03/01/2022    HGB 10.3 (L) 03/01/2022    HCT 29.3 (L) 03/01/2022     Lab Results   Component Value Date    APTT 48.2 (H) 02/27/2022    INR 1.50 02/28/2022  Microbiology:      URINE CULTURE   Date Value Ref Range Status   02/24/2022 50,000 CFU/mL Mixed Flora (A)  Final     INFLUENZA VIRUS TYPE A   Date Value Ref Range Status   02/24/2022 Not Detected Not Detected Final     INFLUENZA VIRUS TYPE B   Date Value Ref Range Status   02/24/2022 Not Detected Not Detected Final           Impression and Plan:    Impression:   1. Right IT femur fracture status post ORIF-date of surgery 02/28/2022     Plan:  1. PT/OT-weightbear as tolerated with use of an assistive device as needed  2. Analgesia  3. DVT prophylaxis  4. Maintain dressing-reinforce as needed  5. Stable from an orthopedic perspective.  Anticipated discharge to rehab facility  6. Follow-up in the clinic in approximately 2 weeks      Rebeca Alert, D.O.  03/01/22 21:28  [place]  Please call with any questions/concerns     This note has been created with voice recognition software.  Please excuse any errors in transcription.  Occasional wrong word or sound alike substitutions may have occurred due to the inherent limitations of voice recognition software.  Please read the chart carefully  and recognize using context with the substitutions may have occurred.

## 2022-03-01 NOTE — Care Plan (Signed)
Problem: Adult Inpatient Plan of Care  Goal: Absence of Hospital-Acquired Illness or Injury  Intervention: Prevent Skin Injury  Recent Flowsheet Documentation  Taken 03/01/2022 2300 by Jesse Sans, RN  Skin Protection:   adhesive use limited   tubing/devices free from skin contact  Taken 03/01/2022 2200 by Jesse Sans, RN  Body Position: weight shift assistance provided     Problem: Adult Inpatient Plan of Care  Goal: Absence of Hospital-Acquired Illness or Injury  Intervention: Prevent and Manage VTE (Venous Thromboembolism) Risk  Recent Flowsheet Documentation  Taken 03/01/2022 2000 by Jesse Sans, RN  VTE Prevention/Management: anticoagulant therapy maintained     Problem: Orthopaedic Fracture  Goal: Absence of Embolism Signs and Symptoms  Intervention: Prevent or Manage Embolism Risk  Recent Flowsheet Documentation  Taken 03/01/2022 2000 by Jesse Sans, RN  VTE Prevention/Management: anticoagulant therapy maintained     Problem: Orthopaedic Fracture  Goal: Optimal Functional Ability  Intervention: Optimize Functional Ability  Recent Flowsheet Documentation  Taken 03/01/2022 2300 by Jesse Sans, RN  Activity Management: activity adjusted per tolerance     Problem: Orthopaedic Fracture  Goal: Optimal Pain Control and Function  Intervention: Manage Acute Orthopaedic-Related Pain  Recent Flowsheet Documentation  Taken 03/01/2022 2300 by Jesse Sans, RN  Sleep/Rest Enhancement:   awakenings minimized   noise level reduced   room darkened     Problem: Orthopaedic Fracture  Goal: Effective Oxygenation and Ventilation  Intervention: Promote Airway Secretion Clearance  Recent Flowsheet Documentation  Taken 03/01/2022 2300 by Jesse Sans, RN  Activity Management: activity adjusted per tolerance     Problem: Skin Injury Risk Increased  Goal: Skin Health and Integrity  Intervention: Optimize Skin Protection  Recent Flowsheet Documentation  Taken 03/01/2022 2300 by Jesse Sans, RN  Skin Protection:   adhesive use  limited   tubing/devices free from skin contact  Activity Management: activity adjusted per tolerance

## 2022-03-01 NOTE — Consults (Signed)
[place] [place] Follow up          Katelyn Craig, [place] A  Date of Admission:  02/26/2022  Date of Birth:  1941-06-29  Date of Service:  03/01/2022    Hospital Day:  LOS: 3 days     Chief Complaint:  Atrial fibrillation and fracture of the hip for which she has had surgery  Subjective:   Katelyn Craig is a 81 y.o. female with fracture of the hip for which she had surgery and has persistent atrial fibrillation.      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%    Vital Signs:  Temp (24hrs) Max:37.3 C (99.2 F)      Temperature: 37.3 C (99.2 F)  BP (Non-Invasive): (!) 161/82  MAP (Non-Invasive): 80 mmHG  Heart Rate: (!) 105  Respiratory Rate: 16  SpO2: 94 %    Current Medications:  alcohol 62 % (NOZIN NASAL SANITIZER) nasal solution, 1 Each, Each Nostril, 2x/day  apixaban (ELIQUIS) tablet, 2.5 mg, Oral, 2x/day  bisacodyl (DULCOLAX) rectal suppository, 10 mg, Rectal, Daily PRN  ceFAZolin (ANCEF) 2 g in D5W 50 mL IVPB, 2 g, Intravenous, Q8H  docusate sodium (COLACE) capsule, 100 mg, Oral, 2x/day  [Held by provider] fentaNYL (DURAGESIC) patch 50 mcg/hr, 1 Patch, Transdermal, Q72H  guaiFENesin (MUCINEX) extended release tablet - for cough (expectorant), 600 mg, Oral, 2x/day  HYDROcodone-acetaminophen (NORCO) 10-325 mg per tablet, 1 Tablet, Oral, Q6H PRN  HYDROmorphone (DILAUDID) 2 mg/mL injection, 1.2 mg, Intravenous, Q3H PRN  levoFLOXacin (LEVAQUIN) tablet 750 mg, 750 mg, Oral, Daily  LORazepam (ATIVAN) tablet, 0.5 mg, Oral, Q6H PRN  LR premix infusion, , Intravenous, Continuous  LR premix infusion, , Intravenous, Continuous  metoprolol succinate (TOPROL-XL) 24 hr extended release tablet, 50 mg, Oral, 2x/day  NS flush syringe, 3 mL, Intracatheter, Q8HRS  NS flush syringe, 3 mL, Intracatheter, Q1H PRN  ondansetron (ZOFRAN) 2 mg/mL injection, 4 mg, Intravenous, Q8H PRN  polyethylene glycol (MIRALAX) oral packet, 17 g, Oral, Daily  sennosides-docusate sodium (SENOKOT-S) 8.6-50mg  per tablet, 1 Tablet, Oral, 2x/day PRN  valsartan (DIOVAN) tablet, 80 mg, Oral, 2x/day            Today's Physical Exam:  Physical Exam .  Has had Craig from fracture of her hip surgery    Monitor has shown atrial fibrillation with ventricular rate 110-130 per minute        I/O:  I/O last 24 hours:      Intake/Output Summary (Last 24 hours) at 03/01/2022 0911  Last data filed at 03/01/2022 0600  Gross per 24 hour   Intake 1590.5 ml   Output 1350 ml   Net 240.5 ml     I/O current shift:  No intake/output data recorded.      Labs  Please indicate ordered or reviewed)  Reviewed:     BMP:      Basic Metabolic Profile    Lab Results   Component Value Date/Time    SODIUM 139 03/01/2022 04:06 AM    POTASSIUM 3.6 03/01/2022 04:06 AM    CHLORIDE 103 03/01/2022 04:06 AM    CO2 27 03/01/2022 04:06 AM    ANIONGAP 9 (L) 03/01/2022 04:06 AM    Lab Results   Component Value Date/Time    BUN 20 03/01/2022 04:06 AM    CREATININE 0.76 03/01/2022 04:06 AM    GLUCOSENF 118 (H) 03/01/2022 04:06 AM          CMP: _0 @  CBC: COMPLETE BLOOD COUNT   Lab Results   Component Value Date    WBC 5.4 03/01/2022    HGB 10.3 (L) 03/01/2022    HCT 29.3 (L) 03/01/2022    PLTCNT 167 03/01/2022    BANDS 3 (L) 02/26/2022       DIFFERENTIAL  Lab Results   Component Value Date    PMNS 66 03/01/2022    LYMPHOCYTES 15 (L) 03/01/2022    MONOCYTES 19 (H) 03/01/2022    EOSINOPHIL 0 03/01/2022    BASOPHILS 1 03/01/2022    BASOPHILS 0.00 03/01/2022    PMNABS 3.50 03/01/2022    LYMPHSABS 0.80 (L) 03/01/2022    EOSABS 0.00 03/01/2022    MONOSABS 1.00 03/01/2022        Hepatic Function:     Hepatic Function    Lab Results   Component Value Date/Time    ALBUMIN 3.8 02/26/2022 01:25 PM    TOTALPROTEIN 7.1 02/26/2022 01:25 PM    ALKPHOS 63 02/26/2022 01:25 PM    PROTHROMTME 17.3 (H) 02/28/2022 04:28 AM    INR 1.50 02/28/2022 04:28 AM    Lab Results   Component Value Date/Time    AST 22 02/26/2022 01:25 PM    ALT 16 02/26/2022 01:25 PM             Magnesium:  MAGNESIUM  Lab Results   Component Value Date    MAGNESIUM 2.0 03/01/2022        PT: COAGULATION TESTS  Lab Results   Component Value Date    PROTHROMTME 17.3 (H) 02/28/2022    APTT 48.2 (H) 02/27/2022    INR 1.50 02/28/2022    HCT 29.3 (L) 03/01/2022    PLTCNT 167 03/01/2022          INR:   No results found for this encounter  PTT:   No results found for this encounter  TROPONIN I  Lab Results   Component Value Date    TROPONINI 24 (H) 02/26/2022    TROPONINI 29 (H) 02/26/2022          No results found for: BNP     Last 3 Cardiac Markers: No results found for this encounter  Blood Gas: No results found for this encounter  Lipid Panel:  No results found for this encounter  TSH:  No results found for this encounter      Diagnostic Tests (Please indicate ordered or reviewed)  Reviewed:     Most Recent EKG This Encounter   ECG 12 LEAD    Collection Time: 02/26/22  1:19 PM   Result Value    Ventricular rate 86    QRS Duration 114    QT Interval 400    QTC Calculation 478    Calculated R Axis 96    Calculated T Axis -39    Narrative    Atrial fibrillation  Rightward axis  ST & T wave abnormality, consider inferior ischemia  Abnormal ECG  No previous ECGs available  Confirmed by Anderson Malta ([id number]) on 02/26/2022 5:43:23 PM     No results found for this or any previous visit.   _0 @   _1 @     Radiology Tests   Reviewed:     FLUORO LOWER EXTREMITY IN OR RIGHT   Final Result   Interval ORIF of the previously noted intertrochanteric fracture of the proximal right femur is seen.            Radiologist location ID: [id number]  XR FEMUR RIGHT   Final Result   Redemonstration of a right femoral proximal intertrochanteric fracture. Otherwise unremarkable right femur radiographs.            Radiologist location ID: [id number]         CT BRAIN WO IV CONTRAST   Final Result   NO ACUTE FINDINGS         One or more dose reduction techniques were used (e.g., Automated exposure control, adjustment of the mA  and/or kV according to patient size, use of iterative reconstruction technique).         Radiologist location ID: [id number]         XR CHEST AP   Final Result   No focal alveolar infiltrate is identified.            Radiologist location ID: [id number]         XR ELBOW RIGHT   Final Result   No acute osseous abnormality.                Radiologist location ID: [id number]         XR HIPS BILATERAL W PELVIS 3-4 VIEWS   Final Result   Intratrochanteric comminuted fracture of the right hip      No evidence of left hip fracture.               Radiologist location ID: [id number]                 Problem List:  Active Hospital Problems   (*Primary Problem)    Diagnosis   . *Hip fracture (CMS HCC)   . A-fib (CMS HCC)   . Multiple myeloma (CMS HCC)     Assessment.      1. Persistent atrial fibrillation.      2. Mild degree of left ventricular dysfunction with ejection fraction 45-50%     3. Fracture of the hip status post surgery    Plan:   Will increase her metoprolol to 50 mg twice a day to control her atrial fibrillation.  Will continue with the apixaban also.    Thank you      Hewitt Shorts, MD

## 2022-03-01 NOTE — Care Plan (Signed)
Patient is post operative, pain controlled with ordered pain medication. Surgical dressing dry and in tacked. Plan for discharge to [facility] for additional rehab.  Problem: Adult Inpatient Plan of Care  Goal: Plan of Care Review  Outcome: Ongoing (see interventions/notes)  Goal: Patient-Specific Goal (Individualized)  Outcome: Ongoing (see interventions/notes)  Goal: Absence of Hospital-Acquired Illness or Injury  Outcome: Ongoing (see interventions/notes)  Goal: Optimal Comfort and Wellbeing  Outcome: Ongoing (see interventions/notes)  Goal: Rounds/Family Conference  Outcome: Ongoing (see interventions/notes)     Problem: Orthopaedic Fracture  Goal: Absence of Bleeding  Outcome: Ongoing (see interventions/notes)  Goal: Effective Bowel Elimination  Outcome: Ongoing (see interventions/notes)  Goal: Absence of Embolism Signs and Symptoms  Outcome: Ongoing (see interventions/notes)  Goal: Fracture Stability  Outcome: Ongoing (see interventions/notes)  Goal: Optimal Functional Ability  Outcome: Ongoing (see interventions/notes)  Goal: Absence of Infection Signs and Symptoms  Outcome: Ongoing (see interventions/notes)  Goal: Effective Tissue Perfusion  Outcome: Ongoing (see interventions/notes)  Goal: Optimal Pain Control and Function  Outcome: Ongoing (see interventions/notes)  Goal: Effective Oxygenation and Ventilation  Outcome: Ongoing (see interventions/notes)     Problem: Fall Injury Risk  Goal: Absence of Fall and Fall-Related Injury  Outcome: Ongoing (see interventions/notes)     Problem: Skin Injury Risk Increased  Goal: Skin Health and Integrity  Outcome: Ongoing (see interventions/notes)

## 2022-03-01 NOTE — PT Evaluation (Signed)
[facility]  [place], [address]  [phone]  [phone]  Rehabilitation Services  Physical Therapy     Patient Name: Katelyn Craig  Date of Birth: 1941/01/09  Height: Height: 172.7 cm ('5\' 8"'$ )  Weight: Weight: 67 kg (147 lb 11.2 oz)  Room/Bed: 367/A  Payor: MEDICARE / Plan: MEDICARE PART A AND B / Product Type: Medicare /     PATIENT DID NOT PARTICIPATE IN THERAPY TODAY DUE TO: ON HOLD PER NURSING: pt with c/o Rt shoulder pain, charge nurse states to hold OOB with PT until x-ray performed/read.  Will follow later in day.        Vonna Kotyk, PT 03/01/2022,11:27

## 2022-03-02 DIAGNOSIS — Z9889 Other specified postprocedural states: Secondary | ICD-10-CM

## 2022-03-02 DIAGNOSIS — M549 Dorsalgia, unspecified: Secondary | ICD-10-CM

## 2022-03-02 DIAGNOSIS — G8929 Other chronic pain: Secondary | ICD-10-CM

## 2022-03-02 LAB — CBC WITH DIFF
HCT: 27.4 % — ABNORMAL LOW (ref 37.0–47.0)
HGB: 9.7 g/dL — ABNORMAL LOW (ref 12.5–16.0)
MCH: 31.3 pg (ref 27.0–32.0)
MCHC: 35.3 g/dL (ref 32.0–36.0)
MCV: 88.4 fL (ref 78.0–99.0)
MPV: 9 fL (ref 7.4–10.4)
PLATELETS: 200 10*3/uL (ref 140–440)
RBC: 3.1 10*6/uL — ABNORMAL LOW (ref 4.20–5.40)
RDW: 15.6 % — ABNORMAL HIGH (ref 11.6–14.8)
WBC: 6 10*3/uL (ref 4.0–10.5)
WBCS UNCORRECTED: 6 10*3/uL

## 2022-03-02 LAB — BPAM PACKED CELL ORDER
UNIT DIVISION: 0
UNIT DIVISION: 0

## 2022-03-02 LAB — MANUAL DIFFERENTIAL
BASOPHIL %: 1 % (ref 0–3)
BASOPHIL ABSOLUTE: 0.06 10*3/uL (ref 0.00–0.30)
BASOPHILS MANUAL: 1
LYMPHOCYTE %: 14 % — ABNORMAL LOW (ref 25–45)
LYMPHOCYTE ABSOLUTE: 0.83 10*3/uL — ABNORMAL LOW (ref 1.10–5.00)
LYMPHOCYTES MANUAL: 14
MONOCYTE %: 12 % (ref 0–12)
MONOCYTE ABSOLUTE: 0.71 10*3/uL (ref 0.00–1.30)
MONOCYTES MANUAL: 12
NEUTROPHIL %: 73 % (ref 40–76)
NEUTROPHIL ABSOLUTE: 4.4 10*3/uL (ref 1.80–8.40)
NEUTROPHILS MANUAL: 74
PLATELET MORPHOLOGY COMMENT: NORMAL
RBC MORPHOLOGY COMMENT: NORMAL
TOTAL CELLS COUNTED [#] IN BLOOD: 101
WBC: 6 10*3/uL

## 2022-03-02 LAB — BASIC METABOLIC PANEL
ANION GAP: 5 mmol/L — ABNORMAL LOW (ref 10–20)
BUN/CREA RATIO: 27 — ABNORMAL HIGH (ref 6–22)
BUN: 18 mg/dL (ref 7–25)
CALCIUM: 7.8 mg/dL — ABNORMAL LOW (ref 8.6–10.3)
CHLORIDE: 102 mmol/L (ref 98–107)
CO2 TOTAL: 30 mmol/L (ref 21–31)
CREATININE: 0.67 mg/dL (ref 0.60–1.30)
ESTIMATED GFR: 89 mL/min/{1.73_m2} (ref >59)
GLUCOSE: 133 mg/dL — ABNORMAL HIGH (ref 74–109)
OSMOLALITY, CALCULATED: 278 mOsm/kg (ref 270–290)
POTASSIUM: 3 mmol/L — ABNORMAL LOW (ref 3.5–5.1)
SODIUM: 137 mmol/L (ref 136–145)

## 2022-03-02 LAB — TYPE AND CROSS RED CELLS - UNITS
ABO/RH(D): A POS
ANTIBODY SCREEN: NEGATIVE
UNITS ORDERED: 2

## 2022-03-02 MED ORDER — POTASSIUM CHLORIDE ER 20 MEQ TABLET,EXTENDED RELEASE(PART/CRYST)
40.0000 meq | ORAL_TABLET | ORAL | Status: AC
Start: 2022-03-02 — End: 2022-03-02
  Administered 2022-03-02 (×3): 40 meq via ORAL
  Filled 2022-03-02 (×3): qty 2

## 2022-03-02 NOTE — Progress Notes (Signed)
ORTHOPAEDIC SURGERY PROGRESS NOTE    Patient Name: Katelyn Craig   MRN: [id number]   Date of Birth: 14-May-1941  Date: 03/02/2022  Age: 81 y.o.    Gender: female  Attending Physician: Lacinda Axon, MD    Subjective:    HPI:  Katelyn Craig is a 81 y.o. female who is status post ORIF right hip-date of surgery 02/28/2022    Patient seen evaluated on morning rounds.  No acute overnight events.  Patient's daughter was at bedside.  Reviewed imaging and operative procedure with a daughter.  Pain controlled.  Patient is about to work with physical therapy.    Objective:    Last Filed Vitals:  Filed Vitals:    03/02/22 0014 03/02/22 0425 03/02/22 0700 03/02/22 0850   BP: 134/84 134/73 (!) 139/127 133/68   Pulse: 98 95 82    Resp: '19 18 18    '$ Temp: 36.8 C (98.3 F) 36.6 C (97.9 F) 36.6 C (97.9 F)    SpO2: 94% 91% 90%         Musculoskeletal Exam:     Examination of the operative extremity demontrates:    Surgical dressings clean dry and intact with no evidence of strike through drainage.  Appropriately tender palpation about the hip.  Motor and sensation grossly intact.  Compartments soft and compressible.  Negative Homan or evidence of DVT.  Foot warm and well perfused.    Radiographs:  Results for orders placed or performed during the hospital encounter of 02/26/22 (from the past 72 hour(s))   FLUORO LOWER EXTREMITY IN OR RIGHT     Status: None    Narrative    Katelyn Craig      PROCEDURE DESCRIPTION: XR HIP    CLINICAL HISTORY: ORIF RIGHT HIP USING LONG GAMMA NAIL    COMPARISON:02/26/2022          FINDINGS: 5 views of the right hip were obtained. The patient is status post ORIF of previously described intertrochanteric fracture of the proximal right femur. A long orthopedic fixation nail is identified traversing the fracture site. Please see the operative note for further detail.        Impression    Interval ORIF of the previously noted intertrochanteric fracture of the proximal right femur is seen.        Radiologist  location ID: [id number]     XR SHOULDER RIGHT     Status: None    Narrative    Katelyn Craig    RADIOLOGIST: Brayton El, MD    XR SHOULDER RIGHT performed on 03/01/2022 2:41 PM    CLINICAL HISTORY: pain upon movement.       right shoulder pain upon movement    TECHNIQUE: 3 view(s) of the right shoulder    COMPARISON:  None.    FINDINGS:   No fracture.  No suspicious bone lesion.  Normal alignment of the acromioclavicular and glenohumeral joints.  Mild to moderate osteoarthrosis of the glenohumeral and joints.  Soft tissues are unremarkable.        Impression    DEGENERATIVE OSTEOARTHROSIS. NO ACUTE FINDINGS.           Radiologist location ID: [id number]          Lab Data   Lab Results   Component Value Date    WBC 6.0 03/02/2022    WBC 6.0 03/02/2022    HGB 9.7 (L) 03/02/2022    HCT 27.4 (L) 03/02/2022     Lab  Results   Component Value Date    APTT 48.2 (H) 02/27/2022    INR 1.50 02/28/2022       Microbiology:      URINE CULTURE   Date Value Ref Range Status   02/24/2022 50,000 CFU/mL Mixed Flora (A)  Final     INFLUENZA VIRUS TYPE A   Date Value Ref Range Status   02/24/2022 Not Detected Not Detected Final     INFLUENZA VIRUS TYPE B   Date Value Ref Range Status   02/24/2022 Not Detected Not Detected Final           Impression and Plan:    Impression:   1. Right IT femur fracture status post ORIF-date of surgery 02/28/2022     Plan:  1. PT/OT-weightbear as tolerated with use of an assistive device as needed  2. Analgesia  3. DVT prophylaxis  4. Maintain dressing-reinforce as needed  5. Stable from an orthopedic perspective.  Anticipated discharge to rehab facility  6. Follow-up in the clinic in approximately 2 weeks      Rebeca Alert, D.O.  03/02/22 11:04  [place]  Please call with any questions/concerns     This note has been created with voice recognition software.  Please excuse any errors in transcription.  Occasional wrong word or sound alike substitutions may  have occurred due to the inherent limitations of voice recognition software.  Please read the chart carefully and recognize using context with the substitutions may have occurred.

## 2022-03-02 NOTE — Nurses Notes (Signed)
[facility]  [place], [address]  [phone]  [phone]  Rehabilitation Department  Physical Therapy    Restorative Aide Note    Date: 03/02/2022  Patient's Name: Katelyn Craig  Date of Birth: 30-Jul-1941  Height: Height: 172.7 cm ('5\' 8"'$ )  Weight: Weight: 67 kg (147 lb 11.2 oz)      Total minutes: 15    Patient effort: good    Patient profile reviewed: yes    Medical Lines: TELEMETRY    Respiratory Status:ROOM AIR    Existing precautions/restrictions:FALL PRECAUTIONS    Pre-treatment status: PATIENT SUPINE IN BED    Communication pre-treatment: NURSE and CHARGE NURSE    Attention/Behavior: WNL/WFL    Pre-treatment HR: 94    Post-treatment HR: 95    PreSpO2: 95    O2 delivery pre-treatment:     Post SpO2: 96    Pre-treatment pain rating: 0/8    Post-treatment pain rating: 0/8    Location of pain: hip    Bed mobility (roll, bridge, scoot, supine to sit): patient moderate assit to eob and roll and bridge    Sitting assistance:  MODERATE ASSIST    Sit to stand: MODERATE ASSIST    Gait: patient ambulated 50 feet with walker and gait belt    Exercise:     Post treatment status: IN VASCULAR CHAIR, NEEDS IN REACH, BED ALARMED          Arther Dames, PAT CARE [facility]  03/02/2022, 15:33

## 2022-03-02 NOTE — Care Plan (Signed)
[facility]  Medical Nutrition Therapy Follow Up    SUBJECTIVE: Patient's appetite is fair.  She denies other difficulty eating.  She drank Ensure in the past when she had nutrition problems related to multiple myeloma.  Average meal intake = 17%    Current Diet Order/Nutrition Support:  DIET REGULAR Do you want to initiate MNT Protocol? Yes  DIETARY ORAL SUPPLEMENTS Oral Supplements with tray: Ensure Enlive-Vanilla; BREAKFAST/DINNER; 1 Each      Plan/Interventions : Add Ensure BID to supplement.  Encourage PO intake.  Goal: Meal intakes >50%    Nolon Nations, RDLD  03/02/2022, 11:24

## 2022-03-02 NOTE — Care Management Notes (Signed)
[facility] Management Note    Patient Name: Katelyn Craig  Date of Birth: [birth year]  Sex: female  Date/Time of Admission: 02/26/2022 12:29 PM  Room/Bed: 367/A  Payor: MEDICARE / Plan: MEDICARE PART A AND B / Product Type: Medicare /    LOS: 4 days   Primary Care Providers:  Sammie Bench, NP, CNP (General)    Admitting Diagnosis:  Hip fracture (CMS [place]) [S72.009A]    Assessment:   Pt is approved at [facility]. Dr. Pearline Cables reported that she will send pt over to [facility] tomorrow, 03/03/22.    Discharge Plan:  Acute Rehab Placement/Return (not psych) (code 25)      The patient will continue to be evaluated for developing discharge needs.     Case Manager: Doroteo Bradford, [place]  Phone: [phone]

## 2022-03-03 ENCOUNTER — Other Ambulatory Visit (HOSPITAL_COMMUNITY)
Admit: 2022-03-03 | Discharge: 2022-03-03 | Disposition: A | Discharge status: Home / Self Care | Payer: Self-pay | Admitting: EXTERNAL

## 2022-03-03 DIAGNOSIS — Z9981 Dependence on supplemental oxygen: Secondary | ICD-10-CM

## 2022-03-03 MED ORDER — VALSARTAN 80 MG TABLET
80.0000 mg | ORAL_TABLET | Freq: Two times a day (BID) | ORAL | 0 refills | Status: AC
Start: 2022-03-03 — End: 2022-04-02

## 2022-03-03 MED ORDER — DOCUSATE SODIUM 100 MG CAPSULE
100.0000 mg | ORAL_CAPSULE | Freq: Two times a day (BID) | ORAL | 0 refills | Status: DC
Start: 2022-03-03 — End: 2022-03-03

## 2022-03-03 MED ORDER — APIXABAN 2.5 MG TABLET
2.5000 mg | ORAL_TABLET | Freq: Two times a day (BID) | ORAL | 0 refills | Status: AC
Start: 2022-03-03 — End: 2022-04-02

## 2022-03-03 MED ORDER — APIXABAN 2.5 MG TABLET
2.5000 mg | ORAL_TABLET | Freq: Two times a day (BID) | ORAL | 0 refills | Status: DC
Start: 2022-03-03 — End: 2022-03-03

## 2022-03-03 MED ORDER — METOPROLOL SUCCINATE ER 50 MG TABLET,EXTENDED RELEASE 24 HR
50.0000 mg | ORAL_TABLET | Freq: Two times a day (BID) | ORAL | 0 refills | Status: AC
Start: 2022-03-03 — End: 2022-04-02

## 2022-03-03 MED ORDER — BISACODYL 10 MG RECTAL SUPPOSITORY
10.0000 mg | Freq: Every day | RECTAL | Status: AC | PRN
Start: 2022-03-03 — End: 2022-04-02

## 2022-03-03 NOTE — Discharge Summary (Signed)
[facility]  DISCHARGE SUMMARY    PATIENT NAME:  Katelyn Craig, Katelyn Craig  MRN:  [id number]  DOB:  Sep 12, 1941    ENCOUNTER DATE:  02/26/2022  INPATIENT ADMISSION DATE: 02/26/2022  DISCHARGE DATE:  03/03/2022    ATTENDING PHYSICIAN: Lacinda Axon, MD  SERVICE: PRN HOSPITALIST 3  PRIMARY CARE PHYSICIAN: Sammie Bench, NP       No lay caregiver identified.      PRIMARY DISCHARGE DIAGNOSIS: Hip fracture (CMS [facility])  Active Hospital Problems    Diagnosis Date Noted   . Principal Problem: Hip fracture (CMS [place]) [S72.009A] 02/26/2022   . A-fib (CMS [place]) [I48.91] 02/27/2022   . Multiple myeloma (CMS [facility]) [C90.00]       Resolved Hospital Problems   No resolved problems to display.     There are no active non-hospital problems to display for this patient.          Current Discharge Medication List      START taking these medications.      Details   apixaban 2.5 mg Tablet  Commonly known as: ELIQUIS   2.5 mg, Oral, 2 TIMES DAILY  Qty: 60 Tablet  Refills: 0     bisacodyL 10 mg Suppository  Commonly known as: DULCOLAX   10 mg, Rectal, DAILY PRN  Refills: 0     metoprolol succinate 50 mg Tablet Sustained Release 24 hr  Commonly known as: TOPROL-XL   50 mg, Oral, 2 TIMES DAILY  Qty: 60 Tablet  Refills: 0     valsartan 80 mg Tablet  Commonly known as: DIOVAN   80 mg, Oral, 2 TIMES DAILY  Qty: 60 Tablet  Refills: 0        CONTINUE these medications - NO CHANGES were made during your visit.      Details   fentaNYL 50 mcg/hr Patch 72 hr  Commonly known as: DURAGESIC   1 Patch, Transdermal, EVERY 72 HOURS  Refills: 0     HYDROcodone-acetaminophen 7.5-325 mg Tablet  Commonly known as: NORCO   1 Tablet, Oral, EVERY 6 HOURS PRN  Refills: 0     potassium chloride 10 mEq Tab Sust.Rel. Particle/Crystal  Commonly known as: K-DUR   10 mEq, Oral, DAILY  Refills: 0     Revlimid 10 mg Capsule  Generic drug: lenalidomide   10 mg, Oral, EVERY 21 DAYS, Take daily for 21 days be off for 7 days then restart  Refills: 0        STOP taking these  medications.    levoFLOXacin 750 mg Tablet  Commonly known as: LEVAQUIN     Telmisartan-Hydrochlorothiazid 80-12.5 mg Tablet          Discharge med list refreshed?  YES     Allergies   Allergen Reactions   . Oxycodone Swelling     Throat swelling; takes hydrocodone at home     HOSPITAL PROCEDURE(S):   No orders of the defined types were placed in this encounter.    Surgical/Procedural Cases on this Admission     Case IDs Date Procedure Surgeon Location Status    [id number] 02/27/22 OPEN REDUCTION INTERNAL FIXATION RIGHT HIP USING LONG [place] NAIL Murvin Donning, DO [facility]    [id number] 02/28/22 OPEN REDUCTION INTERNAL FIXATION RIGHT HIP USING LONG GAMMA NAIL Higinbotham, Nicholas, DO [facility]        REASON FOR HOSPITALIZATION AND HOSPITAL COURSE   BRIEF HPI:  This is a 81 y.o., female admitted for  right hip pain  BRIEF HOSPITAL NARRATIVE:     Katelyn Craig is a 81 y/o white female who presented to [facility] ED with CC of right hip pain. Pt was at [facility]  & fell onto her RIGHT side. Pt began noticing sx hours PTA while at [facility]. Pt states sx are constant. Pt's pain is located in lateral aspect of RIGHT hip. Pt describes pain as sharp in nature. Pain scale = mvmt/10. Pt states that mvmt aggravates & rest alleviates. Pt denies assoc sx. Pt denies F/C/N/V/D, SOB, or CP. Pertinent negatives: recent illness or travel, sick contacts. She was found to have a right hip intertrochanteric fracture, ortho consulted and was scheduled for surgery on June 13, however, patient's HR overnight was as high as 160, Afib and she was given lopressor 63m IV x 1 without improvement, She was also noted to have a fever  Blood cultures drawn, O2 had to be increased to 3L. She was started on mucinex and given incentive spirometer.   Dr QJodi Mourningwas consulted . Patient started on metoprolol and Eliquis. Her EF was 45-50%.  Diovan added as well. She was cleared for surgery. She required FFP for elevated INR and was taken  to surgery June 14 where she underwent ORIF right hip with long cephalomedularry nail by Dr HKathlen [place] Patient continued to have persistent Afib and metoprolol was increased to 50 mg bid. PT ordered and recommended front wheeled walker, shower chair , raised toilet seat, bedside commode. She is WBAT with assistive device as needed. Patient has been accepted at [facility] for intensive inpatient rehabilitation . She can be discharged there today. She will take her last Levaquin tablet today prior to discharge which was initially given for UTI in the ER a couple days before admission for right hip fx. She will need to f/u with ortho, cardiology and her PCP once [facility].     Physical Examination  Vital Stable  NAD  Lungs CTA  Heart Regular   Neuro A&O x3 , speech intact     TRANSITION/POST DISCHARGE CARE/PENDING TESTS/REFERRALS: F/u with ortho and cardiology and PCP     CONDITION ON DISCHARGE:  A. Ambulation: Ambulation with assistive device  B. Self-care Ability: With partial assistance  C. Cognitive Status Alert and Oriented x 3  D. Code status at discharge:       LINES/DRAINS/WOUNDS AT DISCHARGE:   Patient Lines/Drains/Airways Status     Active Line / Dialysis Catheter / Dialysis Graft / Drain / Airway / Wound     Name Placement date Placement time Site Days    Peripheral IV Left Median Cubital  (antecubital fossa) 02/28/22  1916  -- 2    Foley Catheter 02/27/22  0036  -- 4    Wound (Non-Surgical) Right;Posterior Elbow 02/26/22  2250  -- 4    Wound (Non-Surgical) Right Shoulder 02/26/22  2250  -- 4    Surgical Incision Right Hip 02/28/22  1730  -- 2                DISCHARGE DISPOSITION:  [place]:  Post-Discharge Follow Up Appointments     Follow up with CSammie Bench NP    Phone: [phone]   Where: [address] [id number]   Follow up with HMurvin Donning DO in 2 week(s)    Phone: [phone]   [facility]        No discharge procedures  on file.       Verdie Drown, PA-C    Copies sent to Care Team       Relationship Specialty Notifications Start End    Sammie Bench, NP PCP - General NURSE PRACTITIONER  02/24/22     Phone: [phone] Fax: [phone]         [address] [place] [address]          Referring providers can utilize [email or web address] to access their referred [place] patient's information.

## 2022-03-03 NOTE — Nurses Notes (Signed)
Patient yet to void after removal of foley catheter. Bladder scan with result of 59m. Fluids encouraged.

## 2022-03-03 NOTE — Care Plan (Signed)
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  Patient-Specific Goals (Include Timeframe): dsicharge when criteria met  Plan of Care Reviewed With: patient  Goal: Absence of Hospital-Acquired Illness or Injury  Outcome: Ongoing (see interventions/notes)  Intervention: Identify and Manage Fall Risk  Recent Flowsheet Documentation  Taken 03/03/2022 0400 by Susa Loffler, RN  Safety Promotion/Fall Prevention:   activity supervised   fall prevention program maintained   nonskid shoes/slippers when out of bed   safety round/check completed  Taken 03/03/2022 0200 by Susa Loffler, RN  Safety Promotion/Fall Prevention:   activity supervised   fall prevention program maintained   nonskid shoes/slippers when out of bed   safety round/check completed  Taken 03/03/2022 0000 by Susa Loffler, RN  Safety Promotion/Fall Prevention:   activity supervised   fall prevention program maintained   nonskid shoes/slippers when out of bed   safety round/check completed  Taken 03/02/2022 2200 by Susa Loffler, RN  Safety Promotion/Fall Prevention:   activity supervised   fall prevention program maintained   nonskid shoes/slippers when out of bed   safety round/check completed  Taken 03/02/2022 2000 by Susa Loffler, RN  Safety Promotion/Fall Prevention:   activity supervised   fall prevention program maintained   nonskid shoes/slippers when out of bed   safety round/check completed  Intervention: Prevent Skin Injury  Recent Flowsheet Documentation  Taken 03/02/2022 2000 by Susa Loffler, RN  Skin Protection: adhesive use limited  Intervention: Prevent and Manage VTE (Venous Thromboembolism) Risk  Recent Flowsheet Documentation  Taken 03/02/2022 2000 by Susa Loffler,  RN  VTE Prevention/Management: patient refused intervention  Intervention: Prevent Infection  Recent Flowsheet Documentation  Taken 03/03/2022 0400 by Susa Loffler, RN  Infection Prevention:   promote handwashing   rest/sleep promoted  Taken 03/03/2022 0200 by Susa Loffler, RN  Infection Prevention:   promote handwashing   rest/sleep promoted  Taken 03/03/2022 0000 by Susa Loffler, RN  Infection Prevention:   promote handwashing   rest/sleep promoted  Taken 03/02/2022 2200 by Susa Loffler, RN  Infection Prevention:   promote handwashing   rest/sleep promoted  Taken 03/02/2022 2000 by Susa Loffler, RN  Infection Prevention:   promote handwashing   rest/sleep promoted  Goal: Optimal Comfort and Wellbeing  Outcome: Ongoing (see interventions/notes)  Intervention: Provide Person-Centered Care  Recent Flowsheet Documentation  Taken 03/02/2022 2000 by Susa Loffler, RN  Trust Relationship/Rapport: care explained  Goal: Rounds/Family Conference  Outcome: Ongoing (see interventions/notes)     Problem: Orthopaedic Fracture  Goal: Absence of Bleeding  Outcome: Ongoing (see interventions/notes)  Goal: Effective Bowel Elimination  Outcome: Ongoing (see interventions/notes)  Goal: Absence of Embolism Signs and Symptoms  Outcome: Ongoing (see interventions/notes)  Intervention: Prevent or Manage Embolism Risk  Recent Flowsheet Documentation  Taken 03/02/2022 2000 by Susa Loffler, RN  VTE Prevention/Management: patient refused intervention  Goal: Fracture Stability  Outcome: Ongoing (see interventions/notes)  Goal: Optimal Functional Ability  Outcome: Ongoing (see interventions/notes)  Intervention: Optimize Functional Ability  Recent Flowsheet Documentation  Taken 03/02/2022 2000 by Susa Loffler, RN  Activity Management: activity adjusted per tolerance  Goal: Absence of Infection Signs and Symptoms  Outcome: Ongoing (see interventions/notes)  Goal: Effective Tissue Perfusion  Outcome: Ongoing (see interventions/notes)  Goal: Optimal Pain  Control and Function  Outcome: Ongoing (see interventions/notes)  Intervention: Manage Acute Orthopaedic-Related Pain  Recent Flowsheet Documentation  Taken 03/02/2022 2000 by Susa Loffler, RN  Sleep/Rest Enhancement: awakenings minimized  Goal: Effective Oxygenation and Ventilation  Outcome: Ongoing (see interventions/notes)  Intervention: Promote Airway Secretion Clearance  Recent Flowsheet Documentation  Taken 03/02/2022 2000 by Susa Loffler, RN  Cough And Deep Breathing: done independently per patient  Activity Management: activity adjusted per tolerance  Intervention: Optimize Oxygenation and Ventilation  Recent Flowsheet Documentation  Taken 03/02/2022 2000 by Susa Loffler, RN  Head of Bed [facility]) Positioning: [facility] elevated     Problem: Fall Injury Risk  Goal: Absence of Fall and Fall-Related Injury  Outcome: Ongoing (see interventions/notes)  Intervention: Promote Injury-Free Environment  Recent Flowsheet Documentation  Taken 03/03/2022 0400 by Susa Loffler, RN  Safety Promotion/Fall Prevention:   activity supervised   fall prevention program maintained   nonskid shoes/slippers when out of bed   safety round/check completed  Taken 03/03/2022 0200 by Susa Loffler, RN  Safety Promotion/Fall Prevention:   activity supervised   fall prevention program maintained   nonskid shoes/slippers when out of bed   safety round/check completed  Taken 03/03/2022 0000 by Susa Loffler, RN  Safety Promotion/Fall Prevention:   activity supervised   fall prevention program maintained   nonskid shoes/slippers when out of bed   safety round/check completed  Taken 03/02/2022 2200 by Susa Loffler, RN  Safety Promotion/Fall Prevention:   activity supervised   fall prevention program maintained   nonskid shoes/slippers when out of bed   safety round/check completed  Taken 03/02/2022 2000 by Susa Loffler, RN  Safety Promotion/Fall Prevention:   activity supervised   fall prevention program maintained   nonskid shoes/slippers when out of bed    safety round/check completed     Problem: Skin Injury Risk Increased  Goal: Skin Health and Integrity  Outcome: Ongoing (see interventions/notes)  Intervention: Optimize Skin Protection  Recent Flowsheet Documentation  Taken 03/02/2022 2000 by Susa Loffler, RN  Skin Protection: adhesive use limited  Activity Management: activity adjusted per tolerance  Head of Bed (HOB) Positioning: HOB elevated

## 2022-03-03 NOTE — Care Plan (Signed)
[facility]  [place], [id number]  [phone]  [phone]  Rehabilitation Department  Physical Therapy Daily Inpatient Note    Date: 03/03/2022  Patient's Name: Katelyn Craig  Date of Birth: 1941-07-23  Height: Height: 172.7 cm ('5\' 8"'$ )  Weight: Weight: 67 kg (147 lb 11.2 oz)      Plan: Will continue under current POC.         Subjective/Objective/Assessment:  Flowsheet    03/03/22 1015   Rehab Session   Document Type therapy progress note (daily note)   Total PT Minutes: 16   Patient Effort adequate   General Information   Patient Profile Reviewed yes   Medical Lines Foley Catheter;Telemetry   Respiratory Status room air   Pre Treatment Status   Pre Treatment Patient Status Patient supine in bed;Call light within reach;Telephone within reach   Support Present Pre Treatment  Family present   Communication Pre Treatment  Charge Nurse   Cognitive Assessment/Interventions   Behavior/Mood Observations behavior appropriate to situation, WNL/WFL   Orientation Status oriented x 3   Attention WNL/WFL   Follows Commands WNL   Vital Signs   Vitals Comment Patient not hooked up to pulse 02 monitor   Pain Assessment   Additional Documentation Numbers Pain Scale, Pre/Post Treatment (Group)   Pretreatment Pain Rating 8/10   Posttreatment Pain Rating 9/10   Pain Location - Side Right   Pain Location hip   Bed Mobility Assessment/Treatment   Bed Mobility, Assistive Device draw sheet;bed rails   Supine-Sit Independence moderate assist (50% patient effort);2 person assist required   Sit to Supine, Independence moderate assist (50% patient effort)   Safety Issues decreased use of arms for pushing/pulling;decreased use of legs for bridging/pushing   Impairments ROM decreased;strength decreased;pain   Transfer Assessment/Treatment   Sit-Stand [place] minimum assist (75% patient effort);contact guard assist   Stand-Sit Independence minimum assist (75% patient  effort);contact guard assist   Gait Assessment/Treatment   Total Distance Ambulated 40   Independence  minimum assist (75% patient effort);contact guard assist;verbal cues required   Assistive Device  walker, front wheeled   Distance in Feet 40   Deviations  step length decreased;stride length decreased   Safety Issues  step length decreased;steps too close to front of assistive device   Impairments  strength decreased;ROM decreased;balance impaired   Balance Skill Training   Sitting Balance: Static fair + balance   Sitting, Dynamic (Balance) fair balance   Sit-to-Stand Balance fair balance   Standing Balance: Static fair + balance   Standing Balance: Dynamic fair balance                 Intervention minutes: [employer] [place], PTA  03/03/2022, 12:03

## 2022-03-03 NOTE — Nurses Notes (Signed)
Melissa Lilly made aware patient has not voided post Foly removal and bladder scan resulted for 46m. Stated to continue to encourage fluids.

## 2022-03-03 NOTE — Nurses Notes (Signed)
Patient voided at this time, transported to [facility] with discharge packet and personal belongings

## 2022-03-03 NOTE — Nurses Notes (Signed)
Foley catheter removed without difficulty. Balloon intact. Patient tolerated well.

## 2022-03-03 NOTE — Progress Notes (Signed)
[facility]   IP PROGRESS NOTE      [name], [name]  Date of Admission:  02/26/2022  Date of Birth:  [birth year]  Date of Service:  03/03/2022    Hospital Day:  LOS: 4 days         HPI:  Katelyn Craig is a 81 y.o. female admitted with right hip fracture, AFib RVR.  Heart rate is better controlled today.  She is for surgical repair later on today. Eliquis will be restarted after surgery once okay with Orthopedic surgeon.     S. Pt. Has no c/o, family and nursing staff are bedside.     Review of Systems:  General: No fever or chills. No weight changes, fatigue, weakness.   HEENT: No headaches, dizziness, changes in vision, changes in hearing, or difficulty swallowing.    Skin:  No rashes, erythema or bruises.   Cardiac: No chest pain, palpitations, or arrhythmia.    Respiratory: No shortness of breath, cough, or wheezing.  GI: No nausea or vomiting. No abdominal pain.   Urinary: No dysuria, hematuria, or change in frequency.    Vascular: No edema.     Musculoskeletal:  Persistent pain right hip   Neurologic: No loss of sensation, numbness or tingling.   Endocrine: No heat or cold intolerance or polydipsia.   Psychiatric: No insomnia, depression or anxiety.    Physical Examination  General: Patient is alert and oriented to person, place, and time. No acute distress. Communicates appropriately.   Head: Normocephalic and atraumatic.    Eyes: Pupils equally round and react to light and accommodate. Extraocular movements intact.  Conjunctiva normal. Sclerae are normal.    Nose: Nasal passages clear. Mucosa moist.    Throat: Moist oral mucosa. No erythema or exudate of the pharynx. Clear oropharynx.    Neck: Supple. No cervical lymphadenopathy or supraclavicular nodes detected. Trachea midline   Heart:  Irregularly irregular, no murmur heart rate control   Lungs: Clear to auscultation bilaterally with no wheezes or rales. Equal chest excursion.  No conversational dyspnea. No respiratory distress noted.    Abdomen: Soft, nontender, nondistended belly. Bowel sounds are present in all four quadrants. No rigidity.  No guarding.  No ascites.   Extremities: No edema, cyanosis, or clubbing.    Skin: Warm and dry without lesions. No ecchymosis noted.    Neurologic: Cranial nerves II through XII are grossly intact. Sensation to light touch is intact. Strength 5/5 in upper extremities and lower extremities bilaterally.    Genitourinary:  No urinary incontinence or Foley catheter   Psychiatric: Judgment and insight are intact. Mood and affect are appropriate for the situation.      Vital Signs:  Temp (24hrs) Max:36.7 C (98.1 F)      Temperature: 36.7 C (98.1 F)  BP (Non-Invasive): 100/61  MAP (Non-Invasive): 66 mmHG  Heart Rate: 80  Respiratory Rate: 18  SpO2: 96 %    Current Medications:  alcohol 62 % (NOZIN NASAL SANITIZER) nasal solution, 1 Each, Each Nostril, 2x/day  apixaban (ELIQUIS) tablet, 2.5 mg, Oral, 2x/day  bisacodyl (DULCOLAX) rectal suppository, 10 mg, Rectal, Daily PRN  docusate sodium (COLACE) capsule, 100 mg, Oral, 2x/day  [Held by provider] fentaNYL (DURAGESIC) patch 50 mcg/hr, 1 Patch, Transdermal, Q72H  guaiFENesin (MUCINEX) extended release tablet - for cough (expectorant), 600 mg, Oral, 2x/day  HYDROcodone-acetaminophen (NORCO) 10-325 mg per tablet, 1 Tablet, Oral, Q6H PRN  HYDROmorphone (DILAUDID) 2 mg/mL injection, 1.2 mg, Intravenous, Q3H PRN  levoFLOXacin (  LEVAQUIN) tablet 750 mg, 750 mg, Oral, Daily  LORazepam (ATIVAN) tablet, 0.5 mg, Oral, Q6H PRN  LR premix infusion, , Intravenous, Continuous  LR premix infusion, , Intravenous, Continuous  metoprolol succinate (TOPROL-XL) 24 hr extended release tablet, 50 mg, Oral, 2x/day  NS flush syringe, 3 mL, Intracatheter, Q8HRS  NS flush syringe, 3 mL, Intracatheter, Q1H PRN  ondansetron (ZOFRAN) 2 mg/mL injection, 4 mg, Intravenous, Q8H PRN  polyethylene glycol (MIRALAX) oral packet, 17 g, Oral, Daily  sennosides-docusate sodium (SENOKOT-S) 8.6-50mg per  tablet, 1 Tablet, Oral, 2x/day PRN  valsartan (DIOVAN) tablet, 80 mg, Oral, 2x/day        Current Orders:  Active Orders   Diet    DIET REGULAR Do you want to initiate MNT Protocol? Yes     Frequency: All Meals     Number of Occurrences: 1 Occurrences   Nursing    ACTIVITY Activity: TO CHAIR; Instructions: OTHER (SPECIFY IN COMMENTS); Other: OOB & UP TO CHAIR IF OUT OF SURGERY BY 3PM; Weight bearing limits: NO WEIGHT-BEARING LIMITATIONS     Frequency: UNTIL DISCONTINUED     Number of Occurrences: Until Specified    APIXABAN NURSING ORDER     Frequency: UNTIL DISCONTINUED     Number of Occurrences: Until Specified     Order Comments: Nursing Instructions:    1.  Print apixaban (Eliquis) guide using the link in this order.   2.  Nurse to provide apixaban patient guide to all patients and be sure that all new starts have received education by the trained anticoagulation educator.  If additional questions, contact pharmacy.           APPLY SEQUENTIAL COMPRESSION DEVICE     Frequency: ONE TIME     Number of Occurrences: 1 Occurrences    BLADDER SCAN IF PATIENT HAS NOT VOIDED WITHIN 4 HOURS     Frequency: Q4H PRN     Number of Occurrences: Until Specified    BLADDER SCAN PRN IF NO VOID     Frequency: PRN     Number of Occurrences: 1 Occurrences     Order Comments: If scanned urine volume greater than 300 ML, straight cath and call MD of urine cath volume. (enter secondary order for straight cath x1).  If scanned urine volume less than 300 ML rescan in 2 hours and notify MD of scanned volume.      CATHETERIZE WITH A FOLEY CATH, IF > 750ML LEAVE FOLEY IN FOR 24 HOURS.  DISCONTINUE FOLEY CATEHTER 24 HRS POST-OP UNLESS CONTAINDICATED DUE TO MEDICAL NECESSITY     Frequency: Q6H PRN     Number of Occurrences: Until Specified    CONTINUOUS CARDIAC MONITORING (ED USE ONLY)     Frequency: ONE TIME     Number of Occurrences: 1 Occurrences    DECUBITUS PRECAUTIONS: CHECK SACRUM, HEELS, OCCIPUT AND ELBOWS REGULARLY. PAD ANKLES,  ELBOWS, KNEES AND HEELS.  TURN PATIENT IN BED.     Frequency: UNTIL SPECIFIED     Number of Occurrences: Until Specified    FOLEY CATHETER INSERT & MAINTAIN     Frequency: UNTIL DISCONTINUED     Number of Occurrences: Until Specified    HEEL PROTECTORS     Frequency: CONTINUOUS     Number of Occurrences: Until Specified    ICE TO AFFECTED AREA     Frequency: PRN     Number of Occurrences: Until Specified    INCENTIVE SPIROMETRY NURSING     Frequency: [place]  Number of Occurrences: 250 Occurrences    INCENTIVE SPIROMETRY NURSING     Frequency: Q1H [place]     Number of Occurrences: 250 Occurrences    INDWELLING URINARY CATHETER CARE QSHIFT     Frequency: QSHIFT     Number of Occurrences: Until Specified    INTAKE AND OUTPUT Q8H     Frequency: Q8H     Number of Occurrences: Until Specified    INTAKE AND OUTPUT QSHIFT     Frequency: QSHIFT     Number of Occurrences: Until Specified    MAINTAIN SEQUENTIAL COMPRESSION DEVICE     Frequency: CONTINUOUS X 72 HRS     Number of Occurrences: 72 Hours     Order Comments: To LLE only      MISCELLANEOUS MD/DO TO NURSE     Frequency: UNTIL DISCONTINUED     Number of Occurrences: Until Specified     Order Comments: Pls complete meds hx so Admission MedRec can be completed.  Thx.      NOTIFY MD     Frequency: PRN     Number of Occurrences: Until Specified    Notify MD Vital Signs     Frequency: PRN     Number of Occurrences: Until Specified    Notify MD Vital Signs     Frequency: PRN     Number of Occurrences: Until Specified     Order Comments: Notify MD if urine output is less than 500 mL 8 hours after surgery.      NURSING DIET INFORMATION (NOT A DIET ORDER) - ADVANCE DIET AS TOLERATED.  NURSING TO ENTER SECONDARY ORDER FOR ACUTAL DIET ORDER.     Frequency: UNTIL DISCONTINUED     Number of Occurrences: Until Specified     Order Comments: If instructions require a change in diet, nursing needs to enter appropriate secondary diet order.      PT IS HIGH RISK FOR VENOUS THROMBOEMBOLISM      Frequency: CONTINUOUS     Number of Occurrences: Until Specified    PT IS HIGH RISK FOR VENOUS THROMBOEMBOLISM     Frequency: CONTINUOUS     Number of Occurrences: Until Specified    PULSE OXIMETRY Q4H     Frequency: Q4H     Number of Occurrences: Until Specified    REINFORCE DRESSING     Frequency: PRN     Number of Occurrences: Until Specified    TELEMETRY MONITORING - Continuous     Frequency: CONTINUOUS     Number of Occurrences: Until Specified    TRAPEZE TO BED     Frequency: CONTINUOUS     Number of Occurrences: Until Specified    VITAL SIGNS MULTI FREQUENCY     Frequency: UNTIL DISCONTINUED     Number of Occurrences: Until Specified    VITAL SIGNS Q4H     Frequency: Q4H     Number of Occurrences: Until Specified    VITAL SIGNS QSHIFT     Frequency: QSHIFT     Number of Occurrences: Until Specified    WAS PATIENT ON APIXABAN PRIOR TO ADMISSION?     Frequency: UNTIL DISCONTINUED     Number of Occurrences: Until Specified   Code Status    FULL CODE     Frequency: CONTINUOUS     Number of Occurrences: Until Specified   Consult    IP CONSULT TO CARDIOLOGY Requested Provider; Anderson Malta A     Frequency: ONE TIME     Number  of Occurrences: 1 Occurrences     Order Comments: Afib RVR,  pre op cardiac risk  stratification      IP CONSULT TO CARE MANAGEMENT     Frequency: ONE TIME     Number of Occurrences: 1 Occurrences    IP CONSULT TO ORTHOPEDICS Requested Provider; Mervyn Skeeters     Frequency: ONE TIME     Number of Occurrences: 1 Occurrences   Nourishments    DIETARY ORAL SUPPLEMENTS Oral Supplements with tray: Ensure Enlive-Vanilla; BREAKFAST/DINNER; 1 Each     Frequency: All Meals     Number of Occurrences: 1 Occurrences   PT    PT EVALUATE AND TREAT     Frequency: Per Therapist Discretion     Number of Occurrences: 1 Occurrences     Scheduling Instructions:               Respiratory Care    INCENTIVE SPIROMETRY - RT INSTRUCT     Frequency: ONE TIME     Number of Occurrences: 1 Occurrences    INCENTIVE  SPIROMETRY - RT INSTRUCT     Frequency: ONE TIME     Number of Occurrences: 1 Occurrences    OXYGEN - NASAL CANNULA     Frequency: PRN     Number of Occurrences: Until Specified     Order Comments: Flowrate should not exeed 6L/min except [facility] facility.           OXYGEN - NASAL CANNULA     Frequency: PRN     Number of Occurrences: Until Specified     Order Comments: Flowrate should not exeed 6L/min except [facility] facility.           OXYGEN WEANING PARAMETERS     Frequency: UNTIL DISCONTINUED     Number of Occurrences: Until Specified    PULSE OXIMETRY - CONTINUOUS     Frequency: CONTINUOUS     Number of Occurrences: Until Specified   Point of Care Testing    PERFORM POC WHOLE BLOOD GLUCOSE ONCE IF PATIENT DIABETIC     Frequency: PRN     Number of Occurrences: Until Specified   IV    INSERT & MAINTAIN PERIPHERAL IV ACCESS     Frequency: UNTIL DISCONTINUED     Number of Occurrences: Until Specified    PERIPHERAL IV DRESSING CHANGE     Frequency: PRN     Number of Occurrences: Until Specified   Medications    alcohol 62 % (NOZIN NASAL SANITIZER) nasal solution     Frequency: 2x/day     Dose: 1 Each     Route: Each Nostril    apixaban (ELIQUIS) tablet     Frequency: 2x/day     Dose: 2.5 mg     Route: Oral    bisacodyl (DULCOLAX) rectal suppository     Frequency: Daily PRN     Dose: 10 mg     Route: Rectal    docusate sodium (COLACE) capsule     Frequency: 2x/day     Dose: 100 mg     Route: Oral    fentaNYL (DURAGESIC) patch 50 mcg/hr     Frequency: Q72H     Dose: 1 Patch     Route: Transdermal    guaiFENesin (MUCINEX) extended release tablet - for cough (expectorant)     Frequency: 2x/day     Dose: 600 mg     Route: Oral    HYDROcodone-acetaminophen (NORCO) 10-325 mg per tablet  Frequency: Q6H PRN     Dose: 1 Tablet     Route: Oral    HYDROmorphone (DILAUDID) 2 mg/mL injection     Frequency: Q3H PRN     Dose: 1.2 mg     Route: Intravenous    levoFLOXacin (LEVAQUIN) tablet 750 mg     Frequency: Daily     Dose: 750 mg      Route: Oral    LORazepam (ATIVAN) tablet     Frequency: Q6H PRN     Dose: 0.5 mg     Route: Oral    LR premix infusion     Frequency: Continuous     Route: Intravenous    LR premix infusion     Frequency: Continuous     Route: Intravenous    metoprolol succinate (TOPROL-XL) 24 hr extended release tablet     Frequency: 2x/day     Dose: 50 mg     Route: Oral    NS flush syringe     Frequency: Q8HRS     Dose: 3 mL     Route: Intracatheter    NS flush syringe     Frequency: Q1H PRN     Dose: 3 mL     Route: Intracatheter    ondansetron (ZOFRAN) 2 mg/mL injection     Frequency: Q8H PRN     Dose: 4 mg     Route: Intravenous    polyethylene glycol (MIRALAX) oral packet     Frequency: Daily     Dose: 17 g     Route: Oral    sennosides-docusate sodium (SENOKOT-S) 8.6-50mg per tablet     Frequency: 2x/day PRN     Dose: 1 Tablet     Route: Oral    valsartan (DIOVAN) tablet     Frequency: 2x/day     Dose: 80 mg     Route: Oral          I/O:  I/O last 24 hours:      Intake/Output Summary (Last 24 hours) at 03/03/2022 0104  Last data filed at 03/02/2022 1700  Gross per 24 hour   Intake 340 ml   Output 1400 ml   Net -1060 ml     I/O current shift:  No intake/output data recorded.      Labs (Please indicate ordered or reviewed)  Reviewed: Lab Results Today:   Results for orders placed or performed during the hospital encounter of 02/26/22 (from the past 24 hour(s))   BASIC METABOLIC PANEL - AM ONCE   Result Value Ref Range    SODIUM 137 136 - 145 mmol/L    POTASSIUM 3.0 (L) 3.5 - 5.1 mmol/L    CHLORIDE 102 98 - 107 mmol/L    CO2 TOTAL 30 21 - 31 mmol/L    ANION GAP 5 (L) 10 - 20 mmol/L    CALCIUM 7.8 (L) 8.6 - 10.3 mg/dL    GLUCOSE 133 (H) 74 - 109 mg/dL    BUN 18 7 - 25 mg/dL    CREATININE 0.67 0.60 - 1.30 mg/dL    BUN/CREA RATIO 27 (H) 6 - 22    ESTIMATED GFR 89 >59 mL/min/1.32m2    OSMOLALITY, CALCULATED 278 270 - 290 mOsm/kg   CBC WITH DIFF   Result Value Ref Range    WBCS UNCORRECTED 6.0 x10^3/uL    WBC 6.0 4.0 - 10.5 x10^3/uL     RBC 3.10 (L) 4.20 - 5.40 x10^6/uL    HGB 9.7 (L) 12.5 -  16.0 g/dL    HCT 27.4 (L) 37.0 - 47.0 %    MCV 88.4 78.0 - 99.0 fL    MCH 31.3 27.0 - 32.0 pg    MCHC 35.3 32.0 - 36.0 g/dL    RDW 15.6 (H) 11.6 - 14.8 %    PLATELETS 200 140 - 440 x10^3/uL    MPV 9.0 7.4 - 10.4 fL   MANUAL DIFFERENTIAL   Result Value Ref Range    WBC 6.0 x10^3/uL    NEUTROPHIL % 73 40 - 76 %    LYMPHOCYTE % 14 (L) 25 - 45 %    MONOCYTE % 12 0 - 12 %    EOSINOPHIL %      BASOPHIL % 1 0 - 3 %    METAMYELOCYTE %      MYELOCYTE %      PROMYELOCYTE %      BAND %      BLAST %      OTHER %      NEUTROPHIL ABSOLUTE 4.40 1.80 - 8.40 x10^3/uL    LYMPHOCYTE ABSOLUTE 0.83 (L) 1.10 - 5.00 x10^3/uL    MONOCYTE ABSOLUTE 0.71 0.00 - 1.30 x10^3/uL    EOSINOPHIL ABSOLUTE      BASOPHIL ABSOLUTE 0.06 0.00 - 0.30 x10^3/uL    METAMYELOCYTE ABSOLUTE      MYELOCYTE ABSOLUTE      PROMYELOCYTE ABSOLUTE      BLAST ABSOLUTE      OTHER CELL ABSOLUTE      ANISOCYTOSIS      POLYCHROMASIA      POIKILOCYTOSIS      BASOPHILIC STIPPLING      MICROCYTOSIS      MACROCYTOSIS      ROULEAUX      SCHISTOCYTES      SPHEROCYTES      TARGET CELLS      TEARDROP CELLS      OVALOCYTE (ELLIPTOCYTE)      CRENATED RED CELLS      STOMATOCYTES      ACANTHOCYTES (SPUR CELL)      ECHINOCYTE (BURR CELL)      BLISTER CELLS      RBC AGGLUTINATES      HOWELL JOLLY BODIES      ATYPICAL LYMPHOCYTES      TOXIC GRANULATION      DOHLE BODIES      TOXIC VACUOLIZATION      AUER RODS      BASKET CELLS      HYPERSEGMENTATION      LARGE PLATELETS      PLATELET CLUMPS      WBC MORPHOLOGY COMMENT      RBC MORPHOLOGY COMMENT Normal     PLATELET MORPHOLOGY COMMENT Normal     BANDS NEUTROPHILS MANUAL      BAND ABSOLUTE      NEUTROPHILS MANUAL 74     LYMPHOCYTES MANUAL 14     MONOCYTES MANUAL 12     EOSINOPHILS MANUAL      BASOPHILS MANUAL 1     PROMYELOCYTES MANUAL      MYELOCYTES MANUAL      METAMYELOCYTES MANUAL      BLASTS MANUAL      TOTAL CELLS COUNTED [#] IN BLOOD 101     OTHER CELLS MANUAL      NUCLEATED  RBC MANUAL      PLASMA CELL %      PLASMA CELL ABSOLUE      PLASMA CELLS MANUAL  HYPOCHROMASIA         Radiology Tests (Please indicate ordered or reviewed)  Reviewed: XR SHOULDER RIGHT    Result Date: 03/01/2022  Impression DEGENERATIVE OSTEOARTHROSIS. NO ACUTE FINDINGS. Radiologist location ID: [id number]       Problem List:  Active Hospital Problems   (*Primary Problem)    Diagnosis   . *Hip fracture (CMS HCC)   . A-fib (CMS HCC)   . Multiple myeloma (CMS HCC)     POD 1 ORIF r. Hip w/ long cephalomedullary nail    Assessment/ Plan:    She will be continued on rate controlling medications, Eliquis  restarted     Further interventions will be based on clinical course.  Hospitalist has examined the patient, as reviewed all material, agrees with the above medical management at this time.    DVT/PE Prophylaxis:      02/28/22  - given Vit K and 1 unit FFP -> scheduled for sgy this afternoon -> d/w Ortho -> will follow post-op    03/01/22  - Metoprolol 50 mg increased to bid dosing per Cards  - per CM bed available at [facility]  - final disposition per Ortho    Colon Branch, MD

## 2022-03-04 ENCOUNTER — Other Ambulatory Visit: Payer: Medicare Other | Attending: EXTERNAL | Admitting: EXTERNAL

## 2022-03-04 DIAGNOSIS — R69 Illness, unspecified: Secondary | ICD-10-CM

## 2022-03-04 LAB — CBC/DIFF - CLIENT CONSOLIDATED
BASOPHIL #: 0.1 10*3/uL (ref 0.00–0.30)
BASOPHIL %: 1 % (ref 0–3)
EOSINOPHIL #: 0.1 10*3/uL (ref 0.00–0.80)
EOSINOPHIL %: 2 % (ref 0–7)
HCT: 28.5 % — ABNORMAL LOW (ref 37.0–47.0)
HGB: 9.9 g/dL — ABNORMAL LOW (ref 12.5–16.0)
LYMPHOCYTE #: 0.9 10*3/uL — ABNORMAL LOW (ref 1.10–5.00)
LYMPHOCYTE %: 16 % — ABNORMAL LOW (ref 25–45)
MCH: 30.8 pg (ref 27.0–32.0)
MCHC: 34.9 g/dL (ref 32.0–36.0)
MCV: 88.4 fL (ref 78.0–99.0)
MONOCYTE #: 0.9 10*3/uL (ref 0.00–1.30)
MONOCYTE %: 15 % — ABNORMAL HIGH (ref 0–12)
MPV: 8.3 fL (ref 7.4–10.4)
NEUTROPHIL #: 3.9 10*3/uL (ref 1.80–8.40)
NEUTROPHIL %: 66 % (ref 40–76)
PLATELETS: 315 10*3/uL (ref 140–440)
RBC: 3.22 10*6/uL — ABNORMAL LOW (ref 4.20–5.40)
RDW: 16 % — ABNORMAL HIGH (ref 11.6–14.8)
WBC: 5.8 10*3/uL
WBC: 5.8 10*3/uL (ref 4.0–10.5)

## 2022-03-04 LAB — CBC WITH DIFF
BASOPHIL #: 0.1 10*3/uL (ref 0.00–0.30)
BASOPHIL %: 1 % (ref 0–3)
EOSINOPHIL #: 0.1 10*3/uL (ref 0.00–0.80)
EOSINOPHIL %: 2 % (ref 0–7)
HCT: 28.5 % — ABNORMAL LOW (ref 37.0–47.0)
HGB: 9.9 g/dL — ABNORMAL LOW (ref 12.5–16.0)
LYMPHOCYTE #: 0.9 10*3/uL — ABNORMAL LOW (ref 1.10–5.00)
LYMPHOCYTE %: 16 % — ABNORMAL LOW (ref 25–45)
MCH: 30.8 pg (ref 27.0–32.0)
MCHC: 34.9 g/dL (ref 32.0–36.0)
MCV: 88.4 fL (ref 78.0–99.0)
MONOCYTE #: 0.9 10*3/uL (ref 0.00–1.30)
MONOCYTE %: 15 % — ABNORMAL HIGH (ref 0–12)
MPV: 8.3 fL (ref 7.4–10.4)
NEUTROPHIL #: 3.9 10*3/uL (ref 1.80–8.40)
NEUTROPHIL %: 66 % (ref 40–76)
PLATELETS: 315 10*3/uL (ref 140–440)
RBC: 3.22 10*6/uL — ABNORMAL LOW (ref 4.20–5.40)
RDW: 16 % — ABNORMAL HIGH (ref 11.6–14.8)
WBC: 5.8 10*3/uL (ref 4.0–10.5)
WBCS UNCORRECTED: 5.8 10*3/uL

## 2022-03-04 LAB — COMPREHENSIVE METABOLIC PANEL, NON-FASTING
ALBUMIN/GLOBULIN RATIO: 0.9 (ref 0.8–1.4)
ALBUMIN: 2.9 g/dL — ABNORMAL LOW (ref 3.5–5.7)
ALKALINE PHOSPHATASE: 106 U/L — ABNORMAL HIGH (ref 34–104)
ALT (SGPT): 41 U/L (ref 7–52)
ANION GAP: 8 mmol/L — ABNORMAL LOW (ref 10–20)
AST (SGOT): 69 U/L — ABNORMAL HIGH (ref 13–39)
BILIRUBIN TOTAL: 1.1 mg/dL (ref 0.3–1.2)
BUN/CREA RATIO: 31 — ABNORMAL HIGH (ref 6–22)
BUN: 18 mg/dL (ref 7–25)
CALCIUM, CORRECTED: 9.7 mg/dL (ref 8.9–10.8)
CALCIUM: 8.6 mg/dL (ref 8.6–10.3)
CHLORIDE: 106 mmol/L (ref 98–107)
CO2 TOTAL: 25 mmol/L (ref 21–31)
CREATININE: 0.59 mg/dL — ABNORMAL LOW (ref 0.60–1.30)
ESTIMATED GFR: 92 mL/min/{1.73_m2} (ref >59)
GLOBULIN: 3.1 (ref 2.9–5.4)
GLUCOSE: 99 mg/dL (ref 74–109)
OSMOLALITY, CALCULATED: 280 mOsm/kg (ref 270–290)
POTASSIUM: 3.4 mmol/L — ABNORMAL LOW (ref 3.5–5.1)
PROTEIN TOTAL: 6 g/dL — ABNORMAL LOW (ref 6.4–8.9)
SODIUM: 139 mmol/L (ref 136–145)

## 2022-03-04 LAB — ADULT ROUTINE BLOOD CULTURE, SET OF 2 BOTTLES (BACTERIA AND YEAST)
BLOOD CULTURE, ROUTINE: NO GROWTH
BLOOD CULTURE, ROUTINE: NO GROWTH

## 2022-03-05 ENCOUNTER — Other Ambulatory Visit: Payer: Medicare Other | Attending: EXTERNAL | Admitting: EXTERNAL

## 2022-03-05 LAB — CBC WITH DIFF
BASOPHIL #: 0.1 10*3/uL (ref 0.00–0.30)
BASOPHIL %: 1 % (ref 0–3)
EOSINOPHIL #: 0.1 10*3/uL (ref 0.00–0.80)
EOSINOPHIL %: 1 % (ref 0–7)
HCT: 31.9 % — ABNORMAL LOW (ref 37.0–47.0)
HGB: 10.9 g/dL — ABNORMAL LOW (ref 12.5–16.0)
LYMPHOCYTE #: 0.9 10*3/uL — ABNORMAL LOW (ref 1.10–5.00)
LYMPHOCYTE %: 10 % — ABNORMAL LOW (ref 25–45)
MCH: 30.4 pg (ref 27.0–32.0)
MCHC: 34.3 g/dL (ref 32.0–36.0)
MCV: 88.8 fL (ref 78.0–99.0)
MONOCYTE #: 0.8 10*3/uL (ref 0.00–1.30)
MONOCYTE %: 9 % (ref 0–12)
MPV: 8.3 fL (ref 7.4–10.4)
NEUTROPHIL #: 6.9 10*3/uL (ref 1.80–8.40)
NEUTROPHIL %: 79 % — ABNORMAL HIGH (ref 40–76)
PLATELETS: 416 10*3/uL (ref 140–440)
RBC: 3.59 10*6/uL — ABNORMAL LOW (ref 4.20–5.40)
RDW: 16.1 % — ABNORMAL HIGH (ref 11.6–14.8)
WBC: 8.8 10*3/uL (ref 4.0–10.5)
WBCS UNCORRECTED: 8.8 10*3/uL

## 2022-03-05 LAB — COMPREHENSIVE METABOLIC PANEL, NON-FASTING
ALBUMIN/GLOBULIN RATIO: 0.9 (ref 0.8–1.4)
ALBUMIN: 3.2 g/dL — ABNORMAL LOW (ref 3.5–5.7)
ALKALINE PHOSPHATASE: 117 U/L — ABNORMAL HIGH (ref 34–104)
ALT (SGPT): 32 U/L (ref 7–52)
ANION GAP: 8 mmol/L — ABNORMAL LOW (ref 10–20)
AST (SGOT): 36 U/L (ref 13–39)
BILIRUBIN TOTAL: 1 mg/dL (ref 0.3–1.2)
BUN/CREA RATIO: 29 — ABNORMAL HIGH (ref 6–22)
BUN: 19 mg/dL (ref 7–25)
CALCIUM, CORRECTED: 9.9 mg/dL (ref 8.9–10.8)
CALCIUM: 9.1 mg/dL (ref 8.6–10.3)
CHLORIDE: 104 mmol/L (ref 98–107)
CO2 TOTAL: 27 mmol/L (ref 21–31)
CREATININE: 0.66 mg/dL (ref 0.60–1.30)
ESTIMATED GFR: 90 mL/min/{1.73_m2} (ref >59)
GLOBULIN: 3.4 (ref 2.9–5.4)
GLUCOSE: 151 mg/dL — ABNORMAL HIGH (ref 74–109)
OSMOLALITY, CALCULATED: 283 mOsm/kg (ref 270–290)
POTASSIUM: 3.4 mmol/L — ABNORMAL LOW (ref 3.5–5.1)
PROTEIN TOTAL: 6.6 g/dL (ref 6.4–8.9)
SODIUM: 139 mmol/L (ref 136–145)

## 2022-03-05 LAB — CBC/DIFF - CLIENT CONSOLIDATED
BASOPHIL #: 0.1 10*3/uL (ref 0.00–0.30)
BASOPHIL %: 1 % (ref 0–3)
EOSINOPHIL #: 0.1 10*3/uL (ref 0.00–0.80)
EOSINOPHIL %: 1 % (ref 0–7)
HCT: 31.9 % — ABNORMAL LOW (ref 37.0–47.0)
HGB: 10.9 g/dL — ABNORMAL LOW (ref 12.5–16.0)
LYMPHOCYTE #: 0.9 10*3/uL — ABNORMAL LOW (ref 1.10–5.00)
LYMPHOCYTE %: 10 % — ABNORMAL LOW (ref 25–45)
MCH: 30.4 pg (ref 27.0–32.0)
MCHC: 34.3 g/dL (ref 32.0–36.0)
MCV: 88.8 fL (ref 78.0–99.0)
MONOCYTE #: 0.8 10*3/uL (ref 0.00–1.30)
MONOCYTE %: 9 % (ref 0–12)
MPV: 8.3 fL (ref 7.4–10.4)
NEUTROPHIL #: 6.9 10*3/uL (ref 1.80–8.40)
NEUTROPHIL %: 79 % — ABNORMAL HIGH (ref 40–76)
PLATELETS: 416 10*3/uL (ref 140–440)
RBC: 3.59 10*6/uL — ABNORMAL LOW (ref 4.20–5.40)
RDW: 16.1 % — ABNORMAL HIGH (ref 11.6–14.8)
WBC: 8.8 10*3/uL
WBC: 8.8 10*3/uL (ref 4.0–10.5)

## 2022-03-07 ENCOUNTER — Other Ambulatory Visit: Payer: Medicare Other | Attending: EXTERNAL | Admitting: EXTERNAL

## 2022-03-07 DIAGNOSIS — C9000 Multiple myeloma not having achieved remission: Secondary | ICD-10-CM | POA: Insufficient documentation

## 2022-03-07 DIAGNOSIS — I4891 Unspecified atrial fibrillation: Secondary | ICD-10-CM | POA: Insufficient documentation

## 2022-03-07 DIAGNOSIS — S72009A Fracture of unspecified part of neck of unspecified femur, initial encounter for closed fracture: Secondary | ICD-10-CM | POA: Insufficient documentation

## 2022-03-07 LAB — CBC WITH DIFF
BASOPHIL #: 0.1 10*3/uL (ref 0.00–0.30)
BASOPHIL %: 1 % (ref 0–3)
EOSINOPHIL #: 0.1 10*3/uL (ref 0.00–0.80)
EOSINOPHIL %: 1 % (ref 0–7)
HCT: 32 % — ABNORMAL LOW (ref 37.0–47.0)
HGB: 11 g/dL — ABNORMAL LOW (ref 12.5–16.0)
LYMPHOCYTE #: 1.1 10*3/uL (ref 1.10–5.00)
LYMPHOCYTE %: 14 % — ABNORMAL LOW (ref 25–45)
MCH: 30.6 pg (ref 27.0–32.0)
MCHC: 34.2 g/dL (ref 32.0–36.0)
MCV: 89.4 fL (ref 78.0–99.0)
MONOCYTE #: 0.6 10*3/uL (ref 0.00–1.30)
MONOCYTE %: 8 % (ref 0–12)
MPV: 8.2 fL (ref 7.4–10.4)
NEUTROPHIL #: 6.1 10*3/uL (ref 1.80–8.40)
NEUTROPHIL %: 76 % (ref 40–76)
PLATELETS: 477 10*3/uL — ABNORMAL HIGH (ref 140–440)
RBC: 3.58 10*6/uL — ABNORMAL LOW (ref 4.20–5.40)
RDW: 16.1 % — ABNORMAL HIGH (ref 11.6–14.8)
WBC: 8 10*3/uL (ref 4.0–10.5)
WBCS UNCORRECTED: 8 10*3/uL

## 2022-03-07 LAB — CBC/DIFF - CLIENT CONSOLIDATED
BASOPHIL #: 0.1 10*3/uL (ref 0.00–0.30)
BASOPHIL %: 1 % (ref 0–3)
EOSINOPHIL #: 0.1 10*3/uL (ref 0.00–0.80)
EOSINOPHIL %: 1 % (ref 0–7)
HCT: 32 % — ABNORMAL LOW (ref 37.0–47.0)
HGB: 11 g/dL — ABNORMAL LOW (ref 12.5–16.0)
LYMPHOCYTE #: 1.1 10*3/uL (ref 1.10–5.00)
LYMPHOCYTE %: 14 % — ABNORMAL LOW (ref 25–45)
MCH: 30.6 pg (ref 27.0–32.0)
MCHC: 34.2 g/dL (ref 32.0–36.0)
MCV: 89.4 fL (ref 78.0–99.0)
MONOCYTE #: 0.6 10*3/uL (ref 0.00–1.30)
MONOCYTE %: 8 % (ref 0–12)
MPV: 8.2 fL (ref 7.4–10.4)
NEUTROPHIL #: 6.1 10*3/uL (ref 1.80–8.40)
NEUTROPHIL %: 76 % (ref 40–76)
PLATELETS: 477 10*3/uL — ABNORMAL HIGH (ref 140–440)
RBC: 3.58 10*6/uL — ABNORMAL LOW (ref 4.20–5.40)
RDW: 16.1 % — ABNORMAL HIGH (ref 11.6–14.8)
WBC: 8 10*3/uL
WBC: 8 10*3/uL (ref 4.0–10.5)

## 2022-03-07 LAB — COMPREHENSIVE METABOLIC PANEL, NON-FASTING
ALBUMIN/GLOBULIN RATIO: 1 (ref 0.8–1.4)
ALBUMIN: 3.5 g/dL (ref 3.5–5.7)
ALKALINE PHOSPHATASE: 135 U/L — ABNORMAL HIGH (ref 34–104)
ALT (SGPT): 30 U/L (ref 7–52)
ANION GAP: 8 mmol/L — ABNORMAL LOW (ref 10–20)
AST (SGOT): 39 U/L (ref 13–39)
BILIRUBIN TOTAL: 1.2 mg/dL (ref 0.3–1.2)
BUN/CREA RATIO: 25 — ABNORMAL HIGH (ref 6–22)
BUN: 17 mg/dL (ref 7–25)
CALCIUM, CORRECTED: 9.6 mg/dL (ref 8.9–10.8)
CALCIUM: 9.1 mg/dL (ref 8.6–10.3)
CHLORIDE: 104 mmol/L (ref 98–107)
CO2 TOTAL: 27 mmol/L (ref 21–31)
CREATININE: 0.67 mg/dL (ref 0.60–1.30)
ESTIMATED GFR: 89 mL/min/{1.73_m2} (ref >59)
GLOBULIN: 3.5 (ref 2.9–5.4)
GLUCOSE: 131 mg/dL — ABNORMAL HIGH (ref 74–109)
OSMOLALITY, CALCULATED: 281 mOsm/kg (ref 270–290)
POTASSIUM: 3.9 mmol/L (ref 3.5–5.1)
PROTEIN TOTAL: 7 g/dL (ref 6.4–8.9)
SODIUM: 139 mmol/L (ref 136–145)

## 2022-03-09 ENCOUNTER — Other Ambulatory Visit: Payer: Medicare Other | Attending: EXTERNAL | Admitting: EXTERNAL

## 2022-03-09 LAB — CBC WITH DIFF
BASOPHIL #: 0.1 10*3/uL (ref 0.00–0.30)
BASOPHIL %: 2 % (ref 0–3)
EOSINOPHIL #: 0 10*3/uL (ref 0.00–0.80)
EOSINOPHIL %: 1 % (ref 0–7)
HCT: 30.8 % — ABNORMAL LOW (ref 37.0–47.0)
HGB: 10.6 g/dL — ABNORMAL LOW (ref 12.5–16.0)
LYMPHOCYTE #: 0.8 10*3/uL — ABNORMAL LOW (ref 1.10–5.00)
LYMPHOCYTE %: 15 % — ABNORMAL LOW (ref 25–45)
MCH: 30.6 pg (ref 27.0–32.0)
MCHC: 34.4 g/dL (ref 32.0–36.0)
MCV: 89 fL (ref 78.0–99.0)
MONOCYTE #: 0.5 10*3/uL (ref 0.00–1.30)
MONOCYTE %: 10 % (ref 0–12)
MPV: 8.3 fL (ref 7.4–10.4)
NEUTROPHIL #: 4 10*3/uL (ref 1.80–8.40)
NEUTROPHIL %: 73 % (ref 40–76)
PLATELETS: 483 10*3/uL — ABNORMAL HIGH (ref 140–440)
RBC: 3.45 10*6/uL — ABNORMAL LOW (ref 4.20–5.40)
RDW: 16.1 % — ABNORMAL HIGH (ref 11.6–14.8)
WBC: 5.5 10*3/uL (ref 4.0–10.5)
WBCS UNCORRECTED: 5.5 10*3/uL

## 2022-03-09 LAB — COMPREHENSIVE METABOLIC PANEL, NON-FASTING
ALBUMIN/GLOBULIN RATIO: 1.1 (ref 0.8–1.4)
ALBUMIN: 3.5 g/dL (ref 3.5–5.7)
ALKALINE PHOSPHATASE: 166 U/L — ABNORMAL HIGH (ref 34–104)
ALT (SGPT): 29 U/L (ref 7–52)
ANION GAP: 6 mmol/L — ABNORMAL LOW (ref 10–20)
AST (SGOT): 38 U/L (ref 13–39)
BILIRUBIN TOTAL: 1.2 mg/dL (ref 0.3–1.2)
BUN/CREA RATIO: 22 (ref 6–22)
BUN: 14 mg/dL (ref 7–25)
CALCIUM, CORRECTED: 9.5 mg/dL (ref 8.9–10.8)
CALCIUM: 9 mg/dL (ref 8.6–10.3)
CHLORIDE: 107 mmol/L (ref 98–107)
CO2 TOTAL: 28 mmol/L (ref 21–31)
CREATININE: 0.64 mg/dL (ref 0.60–1.30)
ESTIMATED GFR: 90 mL/min/{1.73_m2} (ref >59)
GLOBULIN: 3.3 (ref 2.9–5.4)
GLUCOSE: 112 mg/dL — ABNORMAL HIGH (ref 74–109)
OSMOLALITY, CALCULATED: 283 mOsm/kg (ref 270–290)
POTASSIUM: 3.8 mmol/L (ref 3.5–5.1)
PROTEIN TOTAL: 6.8 g/dL (ref 6.4–8.9)
SODIUM: 141 mmol/L (ref 136–145)

## 2022-03-09 LAB — CBC/DIFF - CLIENT CONSOLIDATED
BASOPHIL #: 0.1 10*3/uL (ref 0.00–0.30)
BASOPHIL %: 2 % (ref 0–3)
EOSINOPHIL #: 0 10*3/uL (ref 0.00–0.80)
EOSINOPHIL %: 1 % (ref 0–7)
HCT: 30.8 % — ABNORMAL LOW (ref 37.0–47.0)
HGB: 10.6 g/dL — ABNORMAL LOW (ref 12.5–16.0)
LYMPHOCYTE #: 0.8 10*3/uL — ABNORMAL LOW (ref 1.10–5.00)
LYMPHOCYTE %: 15 % — ABNORMAL LOW (ref 25–45)
MCH: 30.6 pg (ref 27.0–32.0)
MCHC: 34.4 g/dL (ref 32.0–36.0)
MCV: 89 fL (ref 78.0–99.0)
MONOCYTE #: 0.5 10*3/uL (ref 0.00–1.30)
MONOCYTE %: 10 % (ref 0–12)
MPV: 8.3 fL (ref 7.4–10.4)
NEUTROPHIL #: 4 10*3/uL (ref 1.80–8.40)
NEUTROPHIL %: 73 % (ref 40–76)
PLATELETS: 483 10*3/uL — ABNORMAL HIGH (ref 140–440)
RBC: 3.45 10*6/uL — ABNORMAL LOW (ref 4.20–5.40)
RDW: 16.1 % — ABNORMAL HIGH (ref 11.6–14.8)
WBC: 5.5 10*3/uL
WBC: 5.5 10*3/uL (ref 4.0–10.5)

## 2022-03-12 ENCOUNTER — Other Ambulatory Visit: Payer: Medicare Other | Attending: EXTERNAL | Admitting: EXTERNAL

## 2022-03-12 LAB — CBC WITH DIFF
BASOPHIL #: 0.1 10*3/uL (ref 0.00–0.30)
BASOPHIL %: 2 % (ref 0–3)
EOSINOPHIL #: 0.1 10*3/uL (ref 0.00–0.80)
EOSINOPHIL %: 1 % (ref 0–7)
HCT: 32.1 % — ABNORMAL LOW (ref 37.0–47.0)
HGB: 10.6 g/dL — ABNORMAL LOW (ref 12.5–16.0)
LYMPHOCYTE #: 0.9 10*3/uL — ABNORMAL LOW (ref 1.10–5.00)
LYMPHOCYTE %: 18 % — ABNORMAL LOW (ref 25–45)
MCH: 29.5 pg (ref 27.0–32.0)
MCHC: 33 g/dL (ref 32.0–36.0)
MCV: 89.2 fL (ref 78.0–99.0)
MONOCYTE #: 0.5 10*3/uL (ref 0.00–1.30)
MONOCYTE %: 10 % (ref 0–12)
MPV: 8.5 fL (ref 7.4–10.4)
NEUTROPHIL #: 3.2 10*3/uL (ref 1.80–8.40)
NEUTROPHIL %: 68 % (ref 40–76)
PLATELETS: 449 10*3/uL — ABNORMAL HIGH (ref 140–440)
RBC: 3.59 10*6/uL — ABNORMAL LOW (ref 4.20–5.40)
RDW: 16.3 % — ABNORMAL HIGH (ref 11.6–14.8)
WBC: 4.7 10*3/uL (ref 4.0–10.5)
WBCS UNCORRECTED: 4.7 10*3/uL

## 2022-03-12 LAB — COMPREHENSIVE METABOLIC PANEL, NON-FASTING
ALBUMIN/GLOBULIN RATIO: 1.1 (ref 0.8–1.4)
ALBUMIN: 3.4 g/dL — ABNORMAL LOW (ref 3.5–5.7)
ALKALINE PHOSPHATASE: 214 U/L — ABNORMAL HIGH (ref 34–104)
ALT (SGPT): 16 U/L (ref 7–52)
ANION GAP: 8 mmol/L — ABNORMAL LOW (ref 10–20)
AST (SGOT): 21 U/L (ref 13–39)
BILIRUBIN TOTAL: 0.9 mg/dL (ref 0.3–1.2)
BUN/CREA RATIO: 21 (ref 6–22)
BUN: 12 mg/dL (ref 7–25)
CALCIUM, CORRECTED: 9.5 mg/dL (ref 8.9–10.8)
CALCIUM: 8.9 mg/dL (ref 8.6–10.3)
CHLORIDE: 108 mmol/L — ABNORMAL HIGH (ref 98–107)
CO2 TOTAL: 25 mmol/L (ref 21–31)
CREATININE: 0.58 mg/dL — ABNORMAL LOW (ref 0.60–1.30)
ESTIMATED GFR: 93 mL/min/{1.73_m2} (ref >59)
GLOBULIN: 3.2 (ref 2.9–5.4)
GLUCOSE: 129 mg/dL — ABNORMAL HIGH (ref 74–109)
OSMOLALITY, CALCULATED: 283 mOsm/kg (ref 270–290)
POTASSIUM: 3.9 mmol/L (ref 3.5–5.1)
PROTEIN TOTAL: 6.6 g/dL (ref 6.4–8.9)
SODIUM: 141 mmol/L (ref 136–145)

## 2022-03-12 LAB — CBC/DIFF - CLIENT CONSOLIDATED
BASOPHIL #: 0.1 10*3/uL (ref 0.00–0.30)
BASOPHIL %: 2 % (ref 0–3)
EOSINOPHIL #: 0.1 10*3/uL (ref 0.00–0.80)
EOSINOPHIL %: 1 % (ref 0–7)
HCT: 32.1 % — ABNORMAL LOW (ref 37.0–47.0)
HGB: 10.6 g/dL — ABNORMAL LOW (ref 12.5–16.0)
LYMPHOCYTE #: 0.9 10*3/uL — ABNORMAL LOW (ref 1.10–5.00)
LYMPHOCYTE %: 18 % — ABNORMAL LOW (ref 25–45)
MCH: 29.5 pg (ref 27.0–32.0)
MCHC: 33 g/dL (ref 32.0–36.0)
MCV: 89.2 fL (ref 78.0–99.0)
MONOCYTE #: 0.5 10*3/uL (ref 0.00–1.30)
MONOCYTE %: 10 % (ref 0–12)
MPV: 8.5 fL (ref 7.4–10.4)
NEUTROPHIL #: 3.2 10*3/uL (ref 1.80–8.40)
NEUTROPHIL %: 68 % (ref 40–76)
PLATELETS: 449 10*3/uL — ABNORMAL HIGH (ref 140–440)
RBC: 3.59 10*6/uL — ABNORMAL LOW (ref 4.20–5.40)
RDW: 16.3 % — ABNORMAL HIGH (ref 11.6–14.8)
WBC: 4.7 10*3/uL
WBC: 4.7 10*3/uL (ref 4.0–10.5)

## 2022-03-14 ENCOUNTER — Other Ambulatory Visit: Payer: Medicare Other | Attending: EXTERNAL | Admitting: EXTERNAL

## 2022-03-14 LAB — CBC/DIFF - CLIENT CONSOLIDATED
BASOPHIL #: 0.1 10*3/uL (ref 0.00–0.30)
BASOPHIL %: 2 % (ref 0–3)
EOSINOPHIL #: 0.1 10*3/uL (ref 0.00–0.80)
EOSINOPHIL %: 2 % (ref 0–7)
HCT: 33.3 % — ABNORMAL LOW (ref 37.0–47.0)
HGB: 10.9 g/dL — ABNORMAL LOW (ref 12.5–16.0)
LYMPHOCYTE #: 0.8 10*3/uL — ABNORMAL LOW (ref 1.10–5.00)
LYMPHOCYTE %: 20 % — ABNORMAL LOW (ref 25–45)
MCH: 29.3 pg (ref 27.0–32.0)
MCHC: 32.8 g/dL (ref 32.0–36.0)
MCV: 89.3 fL (ref 78.0–99.0)
MONOCYTE #: 0.6 10*3/uL (ref 0.00–1.30)
MONOCYTE %: 15 % — ABNORMAL HIGH (ref 0–12)
MPV: 8.3 fL (ref 7.4–10.4)
NEUTROPHIL #: 2.5 10*3/uL (ref 1.80–8.40)
NEUTROPHIL %: 61 % (ref 40–76)
PLATELETS: 411 10*3/uL (ref 140–440)
RBC: 3.73 10*6/uL — ABNORMAL LOW (ref 4.20–5.40)
RDW: 16.5 % — ABNORMAL HIGH (ref 11.6–14.8)
WBC: 4.2 10*3/uL
WBC: 4.2 10*3/uL (ref 4.0–10.5)

## 2022-03-14 LAB — COMPREHENSIVE METABOLIC PANEL, NON-FASTING
ALBUMIN/GLOBULIN RATIO: 1.1 (ref 0.8–1.4)
ALBUMIN: 3.5 g/dL (ref 3.5–5.7)
ALKALINE PHOSPHATASE: 241 U/L — ABNORMAL HIGH (ref 34–104)
ALT (SGPT): 12 U/L (ref 7–52)
ANION GAP: 8 mmol/L — ABNORMAL LOW (ref 10–20)
AST (SGOT): 18 U/L (ref 13–39)
BILIRUBIN TOTAL: 0.9 mg/dL (ref 0.3–1.2)
BUN/CREA RATIO: 21 (ref 6–22)
BUN: 14 mg/dL (ref 7–25)
CALCIUM, CORRECTED: 9.5 mg/dL (ref 8.9–10.8)
CALCIUM: 9 mg/dL (ref 8.6–10.3)
CHLORIDE: 108 mmol/L — ABNORMAL HIGH (ref 98–107)
CO2 TOTAL: 25 mmol/L (ref 21–31)
CREATININE: 0.68 mg/dL (ref 0.60–1.30)
ESTIMATED GFR: 89 mL/min/{1.73_m2} (ref >59)
GLOBULIN: 3.1 (ref 2.9–5.4)
GLUCOSE: 119 mg/dL — ABNORMAL HIGH (ref 74–109)
OSMOLALITY, CALCULATED: 283 mOsm/kg (ref 270–290)
POTASSIUM: 4 mmol/L (ref 3.5–5.1)
PROTEIN TOTAL: 6.6 g/dL (ref 6.4–8.9)
SODIUM: 141 mmol/L (ref 136–145)

## 2022-03-14 LAB — CBC WITH DIFF
BASOPHIL #: 0.1 10*3/uL (ref 0.00–0.30)
BASOPHIL %: 2 % (ref 0–3)
EOSINOPHIL #: 0.1 10*3/uL (ref 0.00–0.80)
EOSINOPHIL %: 2 % (ref 0–7)
HCT: 33.3 % — ABNORMAL LOW (ref 37.0–47.0)
HGB: 10.9 g/dL — ABNORMAL LOW (ref 12.5–16.0)
LYMPHOCYTE #: 0.8 10*3/uL — ABNORMAL LOW (ref 1.10–5.00)
LYMPHOCYTE %: 20 % — ABNORMAL LOW (ref 25–45)
MCH: 29.3 pg (ref 27.0–32.0)
MCHC: 32.8 g/dL (ref 32.0–36.0)
MCV: 89.3 fL (ref 78.0–99.0)
MONOCYTE #: 0.6 10*3/uL (ref 0.00–1.30)
MONOCYTE %: 15 % — ABNORMAL HIGH (ref 0–12)
MPV: 8.3 fL (ref 7.4–10.4)
NEUTROPHIL #: 2.5 10*3/uL (ref 1.80–8.40)
NEUTROPHIL %: 61 % (ref 40–76)
PLATELETS: 411 10*3/uL (ref 140–440)
RBC: 3.73 10*6/uL — ABNORMAL LOW (ref 4.20–5.40)
RDW: 16.5 % — ABNORMAL HIGH (ref 11.6–14.8)
WBC: 4.2 10*3/uL (ref 4.0–10.5)
WBCS UNCORRECTED: 4.2 10*3/uL

## 2022-04-25 IMAGING — MR MRI SHOULDER RT W/O CONTRAST
6 series · 38 of 40 positions shown · non-contrast
Comparison: None available.

﻿EXAM:  26771   MRI SHOULDER RT W/O CONTRAST
INDICATION: Right shoulder pain, limited range of motion.
TECHNIQUE: Noncontrast multiplanar, multisequence MRI was performed.

[Series 6: T1 · oblique · right · 3.5mm · 0.33mm/px · 7 of 18 slices shown]
[im 1/18]
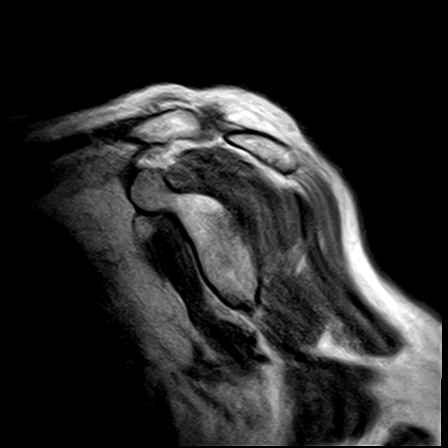
[im 3/18]
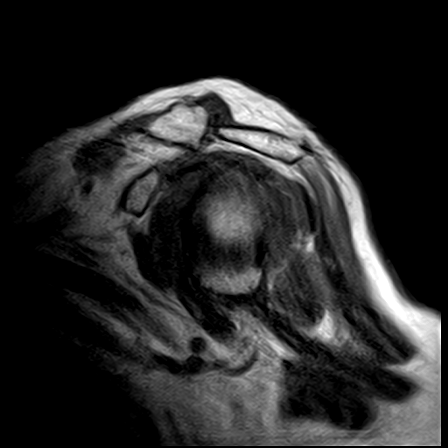
[im 6/18]
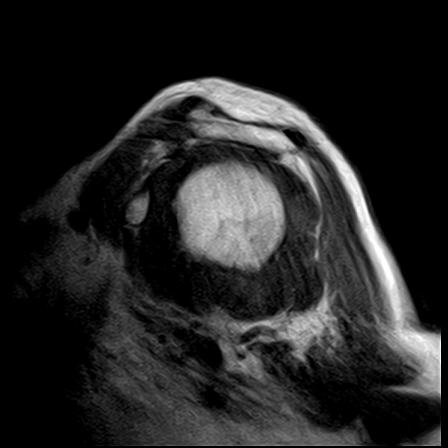
[im 9/18]
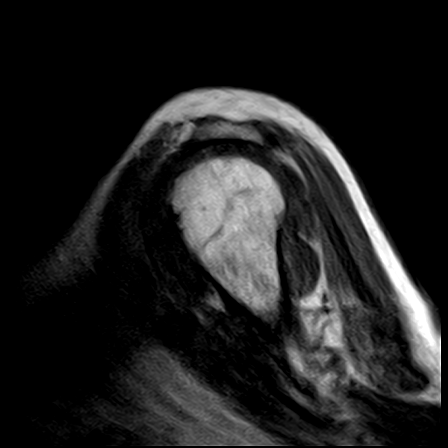
[im 12/18]
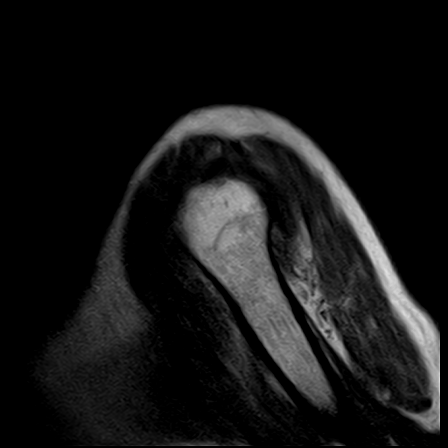
[im 15/18]
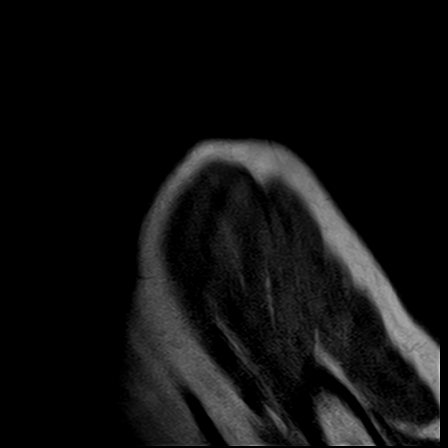
[im 18/18]
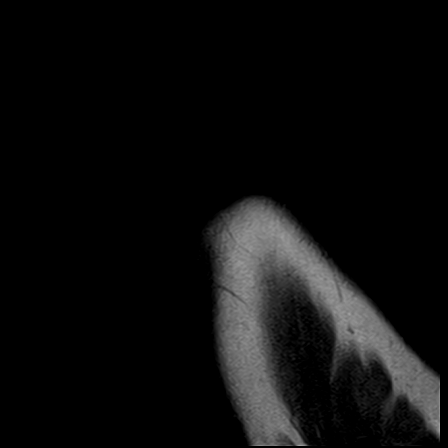

[Series 7: STIR · oblique · right · 3.5mm · 0.47mm/px · 7 of 18 slices shown (1 of 2)]
[im 1/18]
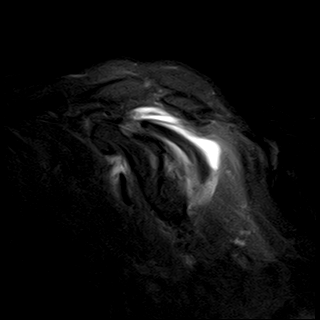
[im 3/18]
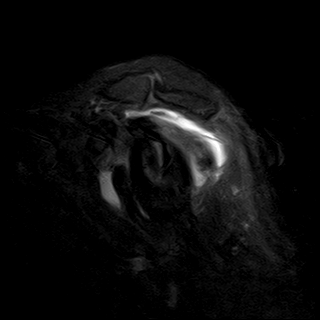
[im 6/18]
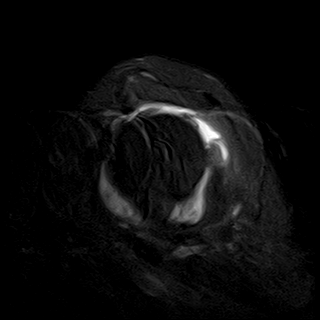
[im 9/18]
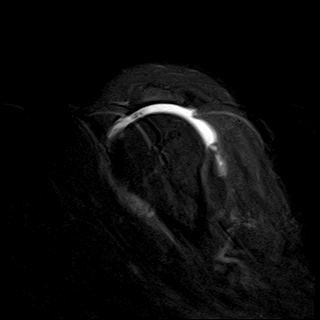
[im 12/18]
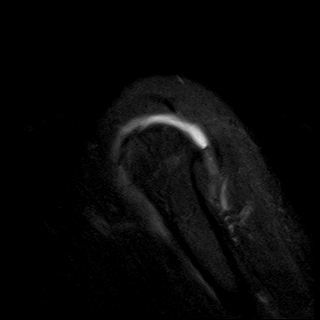
[im 15/18]
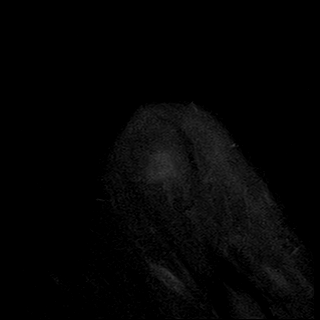
[im 18/18]
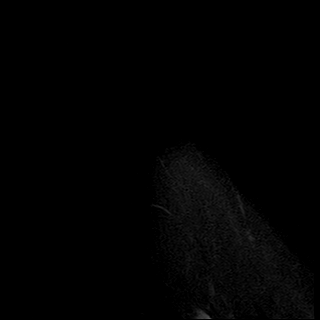

[Series 8: PD fat-sat · axial · right · 4.0mm · 0.50mm/px · z∈[-28,+49]mm · 6 of 18 slices shown (1 of 2)]
[im 1/18]
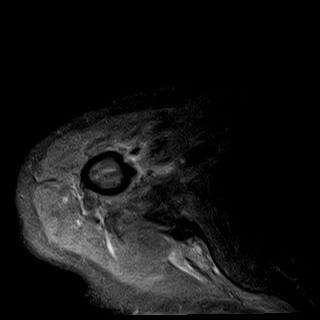
[im 4/18]
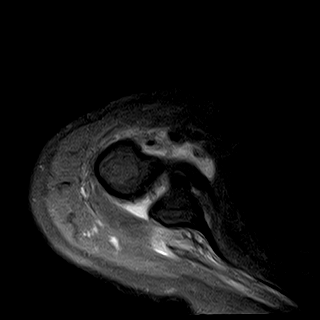
[im 7/18]
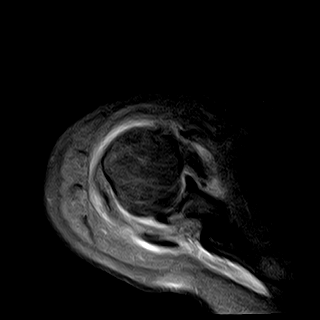
[im 11/18]
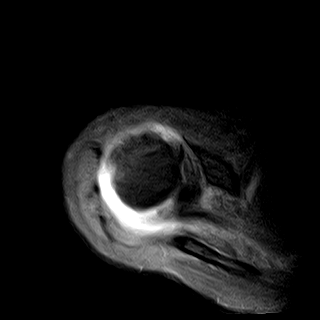
[im 14/18]
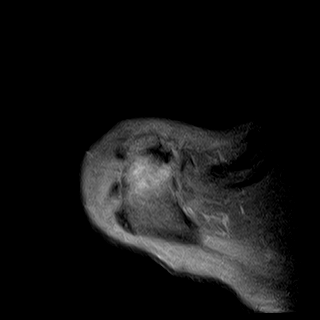
[im 18/18]
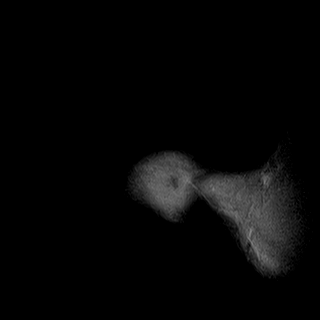

[Series 9: T2 fat-sat · axial · right · 4.0mm · 0.42mm/px · z∈[-41,+62]mm · 8 of 24 slices shown]
[im 1/24]
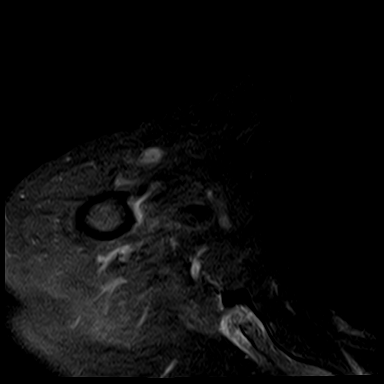
[im 4/24]
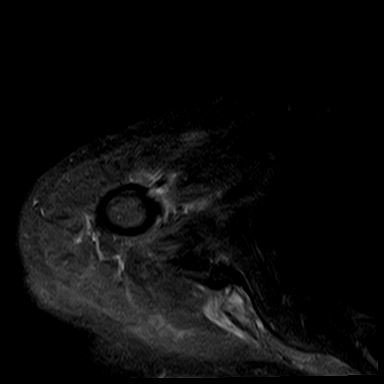
[im 7/24]
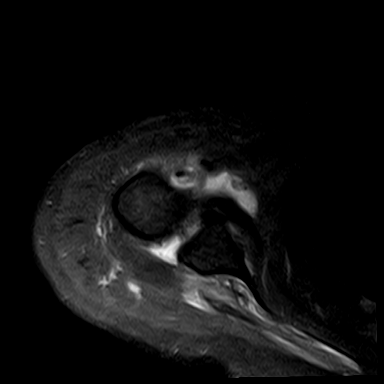
[im 10/24]
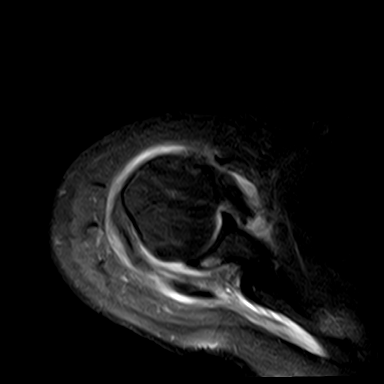
[im 14/24]
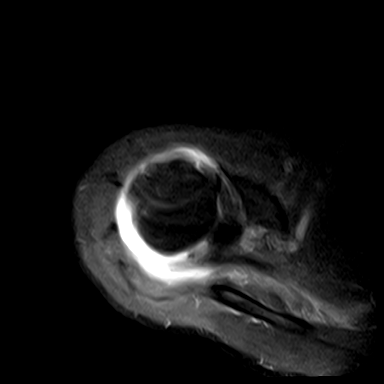
[im 17/24]
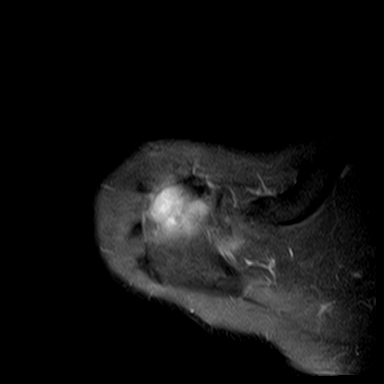
[im 20/24]
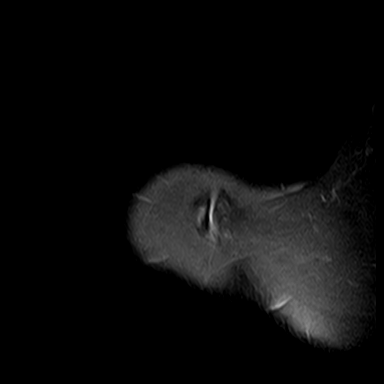
[im 24/24]
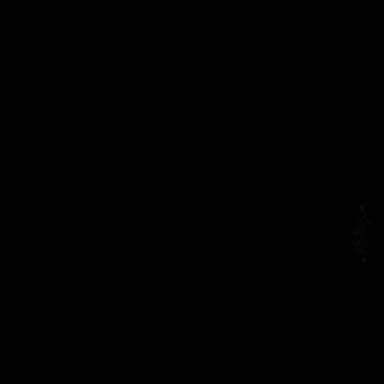

[Series 10: PD fat-sat · oblique · right · 3.5mm · 0.47mm/px · 6 of 18 slices shown (2 of 2)]
[im 1/18]
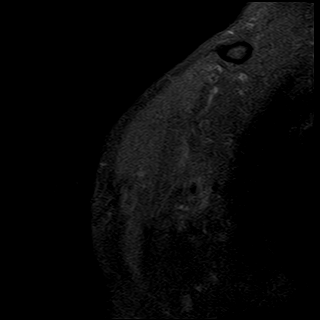
[im 4/18]
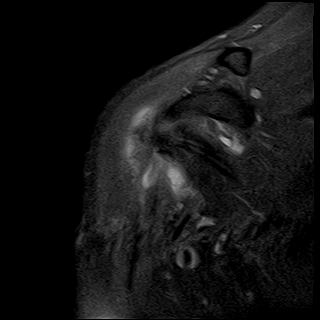
[im 7/18]
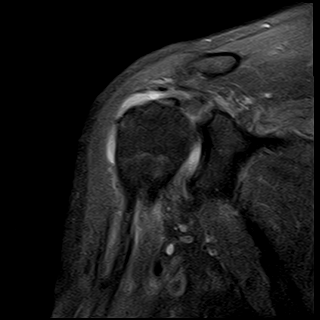
[im 11/18]
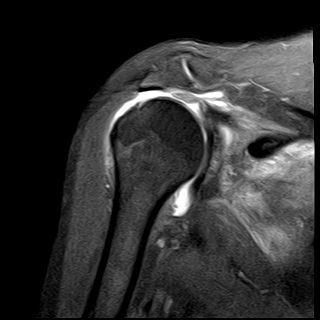
[im 14/18]
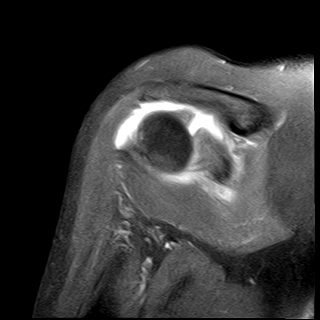
[im 18/18]
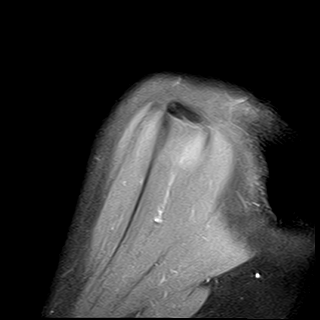

[Series 11: STIR · oblique · right · 3.5mm · 0.47mm/px · 4 of 18 slices shown (2 of 2)]
[im 1/18]
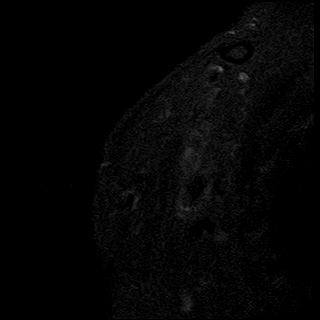
[im 4/18]
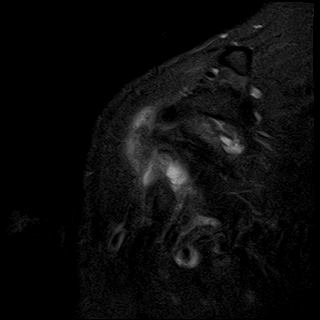
[im 7/18]
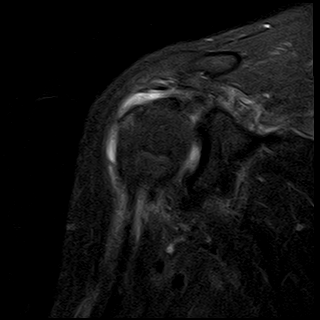
[im 11/18]
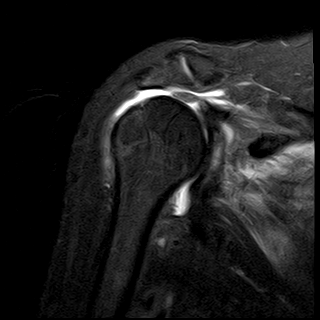

[38 of 40 positions shown; findings below may reference images not displayed]

FINDINGS: There is evidence of a massive rotator cuff tear.  The supraspinatus and infraspinatus tendons are completely torn.  The supraspinatus tendon is retracted approximately 3 cm.  The infraspinatus tendon is not clearly visualized, also compatible with tendon retraction.  

There is marked reduction in the subacromial space.  There is a moderate sized joint effusion.  

There are moderate degenerative changes of the acromioclavicular joint.  The glenoid labrum appears intact.  There is no fracture, dislocation, bone contusion, or significant marrow signal alteration.  There is no evidence of multiple myeloma involving the visualized right shoulder.  The subscapularis tendon is thickened with intermediate signal intensity compatible with tendinosis.
IMPRESSION: Massive rotator cuff tear.

## 2022-12-27 ENCOUNTER — Other Ambulatory Visit (HOSPITAL_COMMUNITY): Payer: Self-pay

## 2022-12-27 DIAGNOSIS — M79605 Pain in left leg: Secondary | ICD-10-CM

## 2022-12-27 DIAGNOSIS — C9001 Multiple myeloma in remission: Secondary | ICD-10-CM

## 2023-01-03 ENCOUNTER — Other Ambulatory Visit: Payer: Self-pay

## 2023-01-03 ENCOUNTER — Inpatient Hospital Stay
Admission: RE | Admit: 2023-01-03 | Discharge: 2023-01-03 | Disposition: A | Discharge status: Home / Self Care | Payer: Medicare Other | Source: Ambulatory Visit

## 2023-01-03 DIAGNOSIS — M79604 Pain in right leg: Secondary | ICD-10-CM

## 2023-01-03 DIAGNOSIS — M79605 Pain in left leg: Secondary | ICD-10-CM | POA: Insufficient documentation

## 2023-01-03 DIAGNOSIS — C9001 Multiple myeloma in remission: Secondary | ICD-10-CM | POA: Insufficient documentation

## 2023-01-08 DIAGNOSIS — M4856XA Collapsed vertebra, not elsewhere classified, lumbar region, initial encounter for fracture: Secondary | ICD-10-CM

## 2023-01-08 DIAGNOSIS — M79605 Pain in left leg: Secondary | ICD-10-CM

## 2023-01-08 DIAGNOSIS — M79604 Pain in right leg: Secondary | ICD-10-CM

## 2023-01-24 ENCOUNTER — Emergency Department (HOSPITAL_COMMUNITY): Payer: Medicare Other

## 2023-01-24 ENCOUNTER — Emergency Department
Admission: EM | Admit: 2023-01-24 | Discharge: 2023-01-24 | Disposition: A | Discharge status: Home / Self Care | Payer: Medicare Other | Attending: Emergency Medicine | Admitting: Emergency Medicine

## 2023-01-24 ENCOUNTER — Other Ambulatory Visit: Payer: Self-pay

## 2023-01-24 DIAGNOSIS — S72302A Unspecified fracture of shaft of left femur, initial encounter for closed fracture: Secondary | ICD-10-CM | POA: Insufficient documentation

## 2023-01-24 DIAGNOSIS — Z7960 Long term (current) use of unspecified immunomodulators and immunosuppressants: Secondary | ICD-10-CM | POA: Insufficient documentation

## 2023-01-24 DIAGNOSIS — S76012A Strain of muscle, fascia and tendon of left hip, initial encounter: Secondary | ICD-10-CM

## 2023-01-24 DIAGNOSIS — X500XXA Overexertion from strenuous movement or load, initial encounter: Secondary | ICD-10-CM

## 2023-01-24 DIAGNOSIS — R52 Pain, unspecified: Secondary | ICD-10-CM

## 2023-01-24 DIAGNOSIS — M25512 Pain in left shoulder: Secondary | ICD-10-CM | POA: Insufficient documentation

## 2023-01-24 DIAGNOSIS — C9000 Multiple myeloma not having achieved remission: Secondary | ICD-10-CM | POA: Insufficient documentation

## 2023-01-24 DIAGNOSIS — W19XXXA Unspecified fall, initial encounter: Secondary | ICD-10-CM | POA: Insufficient documentation

## 2023-01-24 NOTE — ED Nurses Note (Signed)
Patient discharged to home at this time. Discharge instructions provided and reviewed with patient, verbalized understanding.

## 2023-01-24 NOTE — Discharge Instructions (Addendum)
Your x-ray and CT scans show a healing fracture of the left femur.  This is at least several months old.  He is follow up with ortho to make sure this heals properly you may need an MRI of the hip if you are not feeling better

## 2023-01-24 NOTE — ED Provider Notes (Signed)
CHIEF COMPLAINT  Chief Complaint   Patient presents with    Leg Pain     HISTORY OF PRESENT ILLNESS  Katelyn Craig, date of birth [birth year], is a 82 y.o. female who presented to the Emergency Department    82 year old female getting chemotherapy for multiple myeloma.  She felt a pop in her left hip and had pain in that hip radiating down since .  This happened this afternoon.  She had a fall denies any other injury.  This happened while she was trying to stand to get up out of a car.    PAST MEDICAL/SURGICAL/FAMILY/SOCIAL HISTORY  Past Medical History:   Diagnosis Date    Cancer (CMS HCC)     HTN (hypertension)     Multiple myeloma (CMS HCC)        Past Surgical History:   Procedure Laterality Date    HX BACK SURGERY         Family Medical History:    Craig       Social History     Socioeconomic History    Marital status: Married   Tobacco Use    Smoking status: Never     Passive exposure: Never    Smokeless tobacco: Never   Vaping Use    Vaping status: Never Used   Substance and Sexual Activity    Alcohol use: Never    Drug use: Never    Sexual activity: Katelyn Currently      ALLERGIES  Allergies   Allergen Reactions    Oxycodone Swelling     Throat swelling; takes hydrocodone at home       PHYSICAL EXAM  VITAL SIGNS:  Filed Vitals:    01/24/23 1700   BP: 136/78   Pulse: 95   Resp: 20   Temp: 36.7 C (98.1 F)   SpO2: 99%     GENERAL: PATIENT IS ALERT AND ORIENTED TO PERSON, PLACE, AND TIME.  IN NO DISTRESS  HEAD: NORMOCEPHALIC AND ATRAUMATIC.  CARDIOVASCULAR: REGULAR, RATE, AND RHYTHM. NO MURMUR.  LUNGS: CLEAR TO AUSCULTATION BILATERAL.  EXTREMITIES: NO CYANOSIS, CLUBBING, OR EDEMA.  NO GROSS DEFORMITIES, MOVES ALL 4 EXTREMITIES.  Tender over the left hip diffusely  SKIN: WARM AND DRY.  NEUROLOGIC: CRANIAL NERVES II THROUGH XII ARE GROSSLY INTACT ALTHOUGH Katelyn INDIVIDUALLY TESTED.  No gross motor deficits  PSYCHIATRIC: JUDGMENT AND INSIGHT ARE SEEMINGLY INTACT. MOOD AND AFFECT ARE APPROPRIATE FOR THE  SITUATION.    PROCEDURES    DIAGNOSTICS  Labs:  Labs listed below were reviewed and interpreted by me.  No results found for any visits on 01/24/23.  Radiology:  Results for orders placed or performed during the hospital encounter of 01/24/23   XR SHOULDER LEFT     Status: Craig    Narrative    Shernita A Speagle    RADIOLOGIST: Elisha Headland, MD    XR SHOULDER LEFT performed on 01/24/2023 5:28 PM    CLINICAL HISTORY: pain.  pain in her left leg, can Katelyn put weight on it. Pain in left shoulder as well    TECHNIQUE: 3 view(s) of the left shoulder    COMPARISON:  07/13/2014    FINDINGS:   No fracture.  No suspicious bone lesion.  Normal alignment of the acromioclavicular and glenohumeral joints.  Soft tissues are unremarkable.        Impression    NEGATIVE SHOULDER SERIES           Radiologist location ID: [id number]  XR FEMUR LEFT     Status: Craig    Narrative    Sadee A Stofko    RADIOLOGIST: Elisha Headland, MD    XR FEMUR LEFT- 2 VIEWS performed on 01/24/2023 5:28 PM    CLINICAL HISTORY: pain.  pain in her left leg, can Katelyn put weight on it. Pain in left shoulder as well    TECHNIQUE:  2 views of the left femur.    COMPARISON:  02/26/2022    FINDINGS:   There is no evidence of AVN. There is osteophyte formation involving the femoral head. There is mild to moderate narrowing of the hip joint space. There is degenerative subchondral sclerosis with osteophyte formation involving the acetabulum.    There is a nondisplaced obliquely oriented fracture involving the proximal humeral shaft. There is callus formation at the fracture site consistent with some degree of healing.  Normal alignment at the hip and knee.  Soft tissues are unremarkable.        Impression    Nondisplaced ununited obliquely oriented fracture involving the proximal left humeral shaft. There is callus formation at the fracture site consistent with some degree of healing.    Degenerative changes as described.         Radiologist location ID:  [id number]     XR HIP LEFT W PELVIS 2-3 VIEWS     Status: Craig    Narrative    Shuntel A Shrieves    RADIOLOGIST: Elisha Headland, MD    XR HIP LEFT W PELVIS 2-3 VIEWS performed on 01/24/2023 6:00 PM    CLINICAL HISTORY: left hip pain.  left hip pain    TECHNIQUE:  3 views of the left hip including AP pelvis.    COMPARISON:  Craig.    FINDINGS:   There is a nondisplaced nonangulated ununited fracture involving the proximal left femoral shaft. There is callus formation at the fracture site consistent with some degree of fracture healing. There are osteophytes involving the femoral head. There are partially visualized pedicle screws involving the lumbar spine. The patient has undergone right proximal femoral fracture repair with a compression screw and intramedullary rod. The visualized hardware is intact. There is moderate narrowing of the left hip joint space with degenerative subchondral sclerosis and osteophyte formation involving the acetabulum. There is no evidence of AVN.  Normal alignment.  Soft tissues are unremarkable.        Impression    Left proximal femoral shaft fracture as described.           Radiologist location ID: [id number]     CT PELVIS WO IV CONTRAST     Status: Craig    Narrative    Elliot A Power    RADIOLOGIST: Elisha Headland, MD    CT PELVIS WO IV CONTRAST performed on 01/24/2023 7:10 PM    CLINICAL HISTORY: Left hip pain.  lt hip pain, hx of fall, hx of back surgery and rt hip    TECHNIQUE:  Pelvis CT without contrast.    COMPARISON: Craig.    FINDINGS:  Bones: The bones are osteopenic. The patient has undergone surgical fracture repair of the right femur with a compression screw and intramedullary rod. There is a nondisplaced nonangulated ununited transversely oriented fracture involving the proximal left femoral shaft. There is callus formation at the fracture site consistent with some degree of healing.    Hip Joints: There is mild to moderate narrowing of the left hip joint space with  degenerative  subchondral sclerosis involving the acetabulum. There is degenerative osteophyte formation involving acetabulum and left femoral head. There is no evidence of AVN. There are degenerative facet changes involving the visualized lumbar spine with partial visualization of pedicle screws.    SI Joints: Unremarkable    Soft Tissues: No large hematoma.        Impression    Proximal left femoral fracture as described.      One or more dose reduction techniques were used (e.g., Automated exposure control, adjustment of the mA and/or kV according to patient size, use of iterative reconstruction technique).      Radiologist location ID: [id number]         ED COURSE/MEDICAL DECISION MAKING          Medical Decision Making  82 year old female felt a pop in her left hip after trying to stand up to get out of a car.  She did Katelyn fall.  Distal hip turned to stand up.  She can bear weight but is very painful for her.  She was worried about left shoulder pain no chest pain no nausea vomiting has no other differential would include hip fracture, hip dislocation, strain or sprain possible ligamentous or tendon injury x-ray revealed an old partially healing fracture CT confirmed this with no acute findings.      CRITICAL CARE    CLINICAL IMPRESSION  Clinical Impression   Pain   Hip strain, left, initial encounter (Primary)     DISPOSITION  Discharged       DISCHARGE MEDICATIONS  Current Discharge Medication List          //Doug Kinnie Scales M.D.   01/24/2023, 19:11   [facility]  Department of Emergency Medicine  [facility]    This note was partially generated using MModal Fluency Direct system, and there may be some incorrect words, spellings, and punctuation that were Katelyn noted in checking the note before saving.    -----

## 2023-01-24 NOTE — ED Triage Notes (Signed)
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 Felt a pop after tx today

## 2023-01-31 ENCOUNTER — Ambulatory Visit (HOSPITAL_COMMUNITY)
Admission: RE | Admit: 2023-01-31 | Discharge: 2023-01-31 | Disposition: A | Discharge status: Home / Self Care | Payer: Self-pay | Source: Ambulatory Visit

## 2023-05-29 IMAGING — MR MRI SHOULDER RT W/O CONTRAST
6 of 8 series · 27 of 40 positions shown · IV contrast (gadolinium)
Comparison: MRI right shoulder dated 04/25/2022.

﻿EXAM:  [id number]   MRI SHOULDER RT W/O CONTRAST
INDICATION: Shoulder pain. Diminished range of motion. Rotator cuff tear.  History of multiple myeloma. No history of shoulder surgery.
TECHNIQUE: Multiplanar multisequential MRI of the right shoulder was performed without gadolinium contrast.

[Series 6: T2 · sagittal · right · 4.5mm · 0.39mm/px · 5 of 24 slices shown]
[im 1/24]
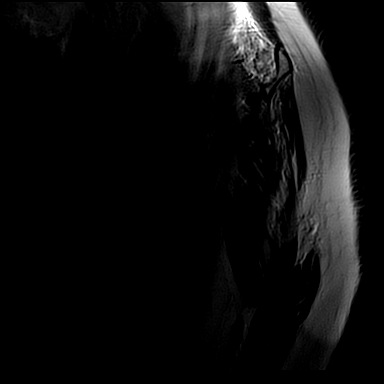
[im 6/24]
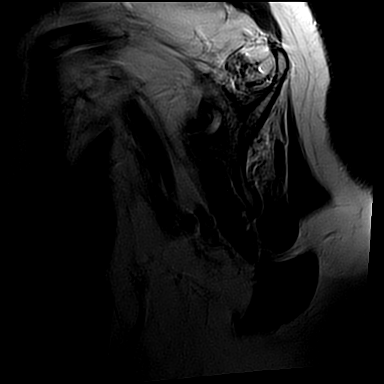
[im 12/24]
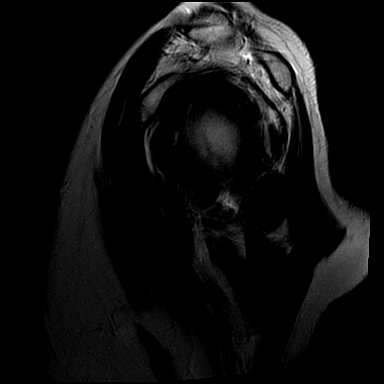
[im 18/24]
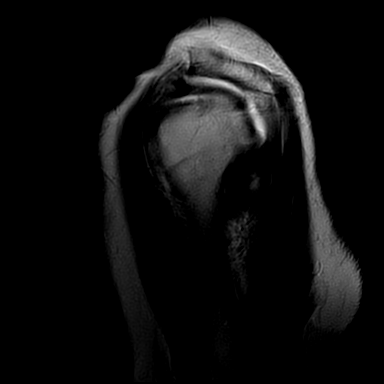
[im 24/24]
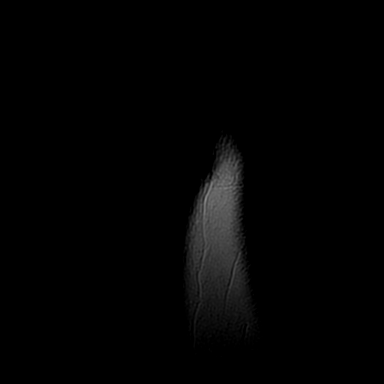

[Series 8: T1 · sagittal · right · 4.0mm · 0.31mm/px · 6 of 28 slices shown]
[im 1/28]
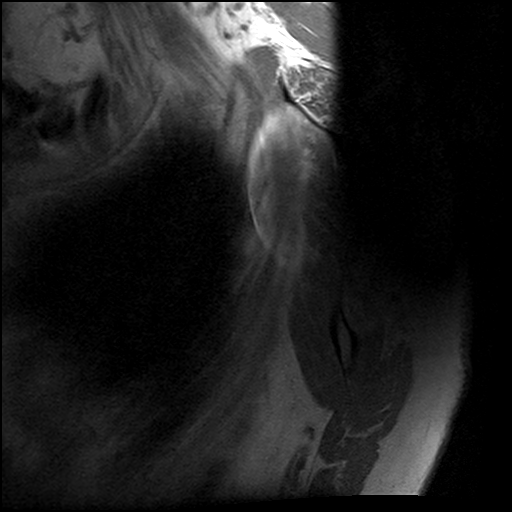
[im 6/28]
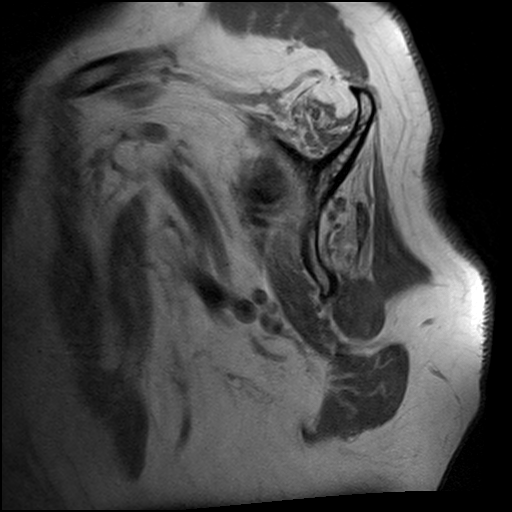
[im 11/28]
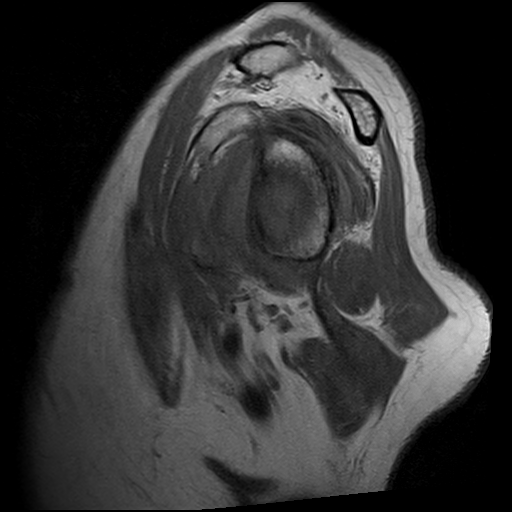
[im 17/28]
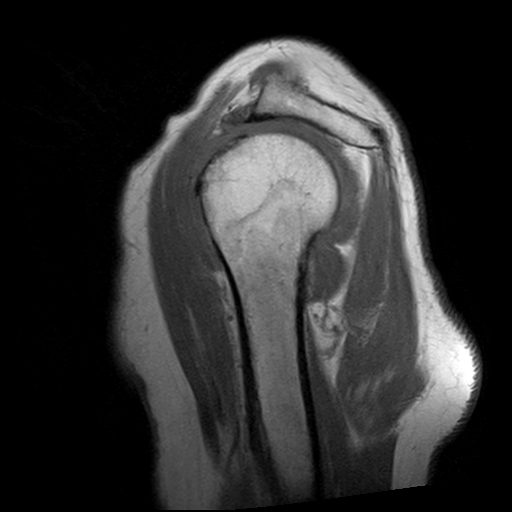
[im 22/28]
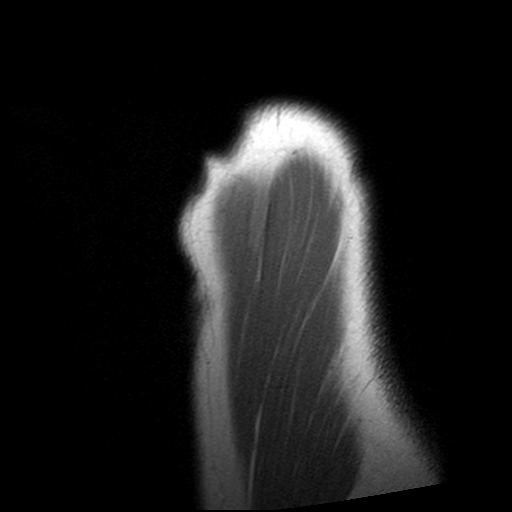
[im 28/28]
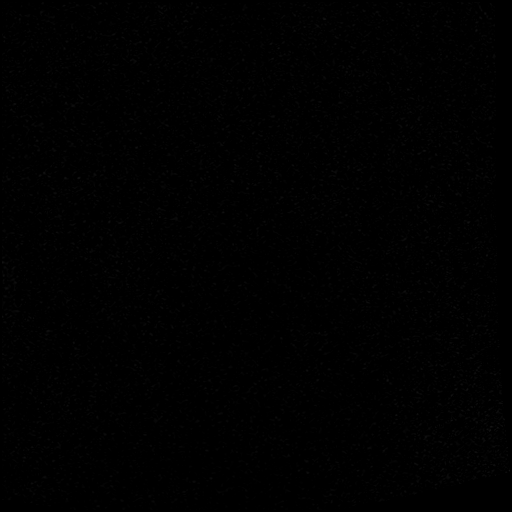

[Series 9: STIR · sagittal · right · 4.0mm · 0.36mm/px · 2 of 28 slices shown]
[im 1/28]
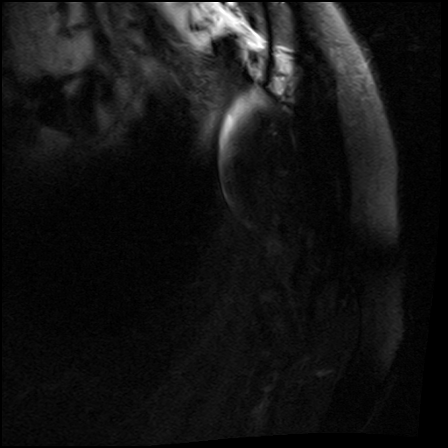
[im 6/28]
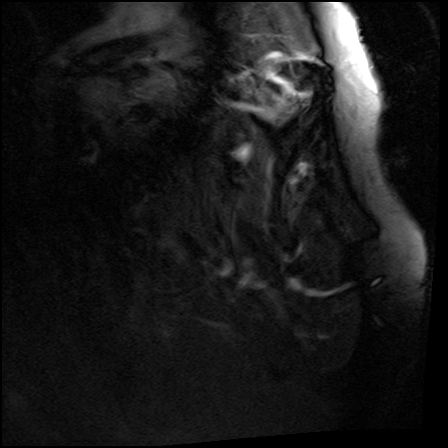

[Series 12: PD fat-sat · axial · right · 4.5mm · 0.36mm/px · z∈[-71,+41]mm · 5 of 24 slices shown (1 of 2)]
[im 1/24]
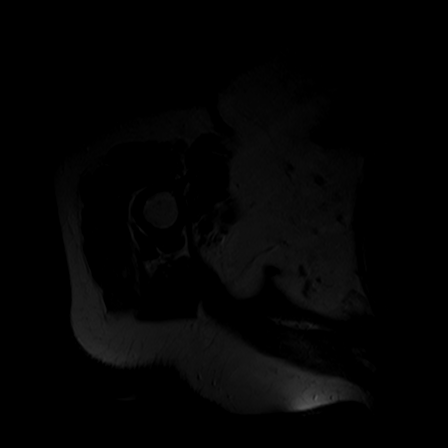
[im 6/24]
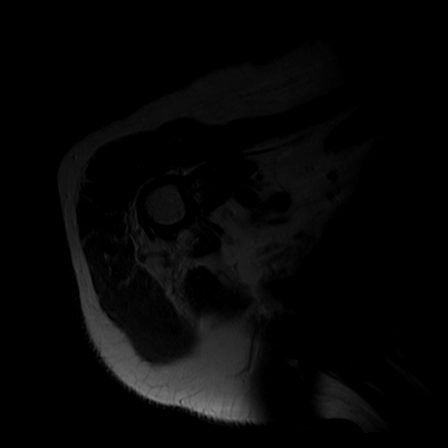
[im 12/24]
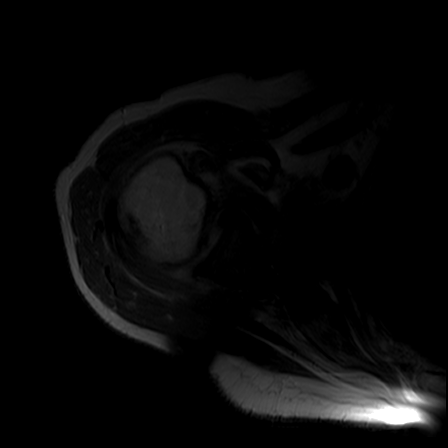
[im 18/24]
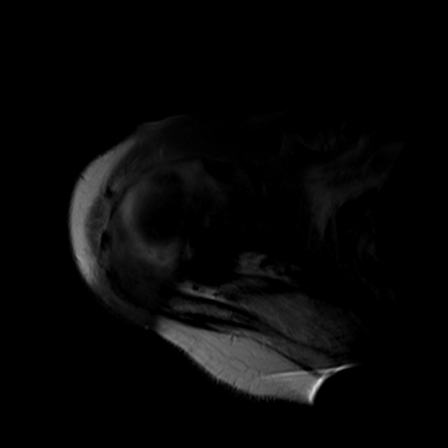
[im 24/24]
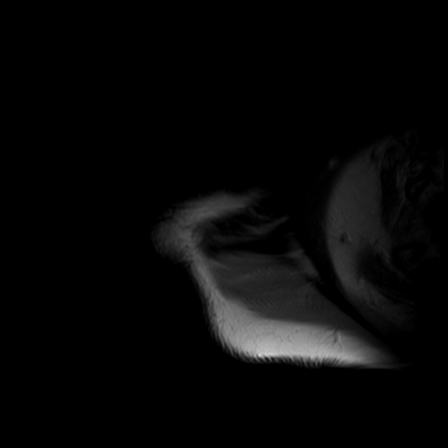

[Series 13: T2 fat-sat · axial · right · 4.5mm · 0.42mm/px · z∈[-69,+37]mm · 5 of 24 slices shown]
[im 1/24]
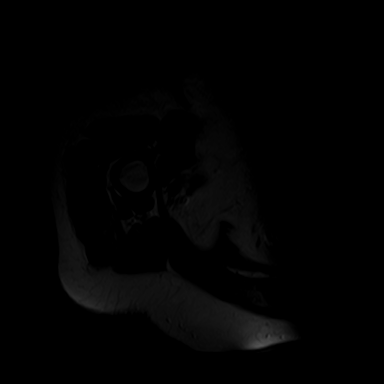
[im 6/24]
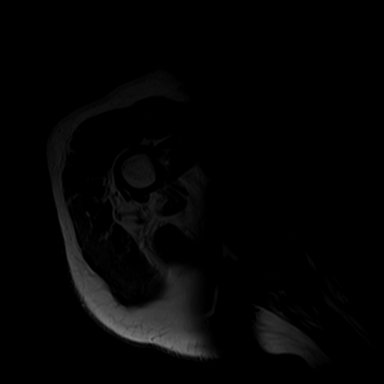
[im 12/24]
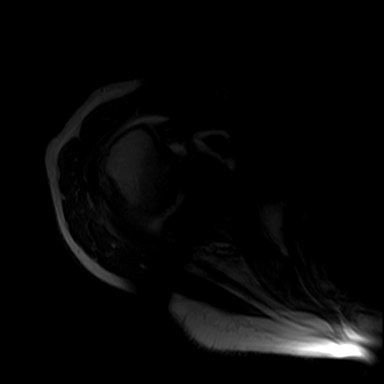
[im 18/24]
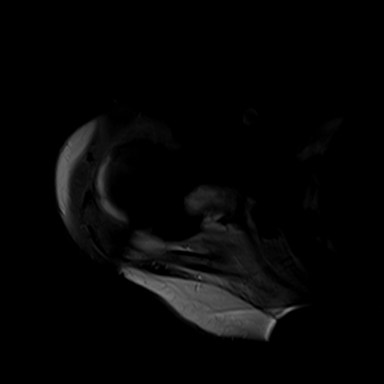
[im 24/24]
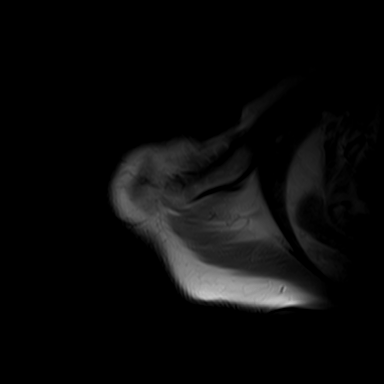

[Series 15: PD fat-sat · coronal · right · 4.0mm · 0.47mm/px · 4 of 20 slices shown (2 of 2)]
[im 1/20]
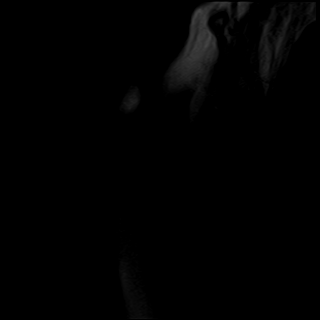
[im 7/20]
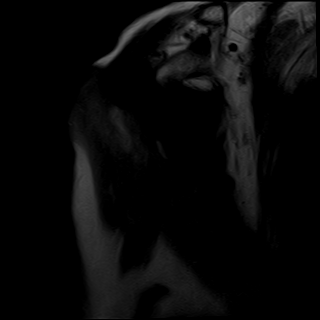
[im 13/20]
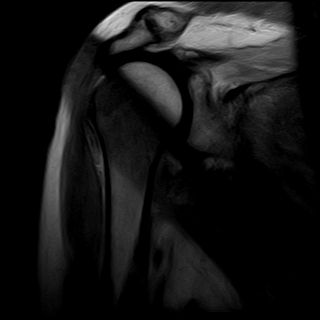
[im 20/20]
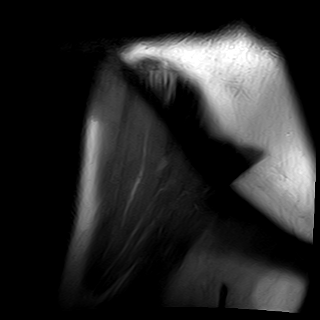

[27 of 40 positions shown; findings below may reference images not displayed]

FINDINGS: Some of the sequences are significantly compromised by motion. Overall quality is acceptable for interpretation.

No acute bony lesions are seen at the right shoulder.

Evidence of large retracted full-thickness tear of distal supraspinatus tendon is noted similar to previous examination.  Also noted is a large retracted full-thickness tear of the superior aspect of the infraspinatus tendon.  

Significant atrophic changes of supraspinatus muscle.  Significant atrophic changes of superior aspect of the infraspinatus muscle.  Degenerative arthritis of AC joint is impinging on the subacromion space and the supraspinatus.

Degenerative arthritis of glenohumeral joint and glenoid labrum. No evidence of labral tear.  Poor visualization of proximal long head of biceps tendon suggestive of disruption of the proximal long head of biceps tendon.
IMPRESSION: 1. Suboptimal evaluation due to motion artifacts.  Overall quality is acceptable. 

2. Large retracted full-thickness tear of distal supraspinatus tendon and full-thickness tear of the superior aspect of the infraspinatus tendon are noted.

3. Significant atrophic changes of supraspinatus muscle and superior aspect of the infraspinatus muscle.  Degenerative changes of AC joint is impinging on the subacromion space and the supraspinatus.

4. Disruption of proximal long head of biceps tendon. Degenerative changes of glenohumeral joint.

5. Overall findings are unchanged from prior examination of 04/25/2022.

## 2023-06-11 ENCOUNTER — Other Ambulatory Visit (HOSPITAL_COMMUNITY): Payer: Self-pay

## 2023-06-11 DIAGNOSIS — C9000 Multiple myeloma not having achieved remission: Secondary | ICD-10-CM

## 2023-06-13 ENCOUNTER — Other Ambulatory Visit (HOSPITAL_COMMUNITY): Payer: Self-pay

## 2023-06-27 ENCOUNTER — Inpatient Hospital Stay
Admission: RE | Admit: 2023-06-27 | Discharge: 2023-06-27 | Disposition: A | Discharge status: Home / Self Care | Payer: Medicare Other | Source: Ambulatory Visit

## 2023-06-27 ENCOUNTER — Other Ambulatory Visit: Payer: Self-pay

## 2023-06-27 DIAGNOSIS — C9000 Multiple myeloma not having achieved remission: Secondary | ICD-10-CM | POA: Insufficient documentation

## 2023-06-30 DIAGNOSIS — S24104A Unspecified injury at T11-T12 level of thoracic spinal cord, initial encounter: Secondary | ICD-10-CM

## 2023-06-30 DIAGNOSIS — Z96642 Presence of left artificial hip joint: Secondary | ICD-10-CM

## 2023-06-30 DIAGNOSIS — C9000 Multiple myeloma not having achieved remission: Secondary | ICD-10-CM

## 2024-07-14 ENCOUNTER — Other Ambulatory Visit (HOSPITAL_COMMUNITY): Payer: Self-pay

## 2024-07-28 ENCOUNTER — Other Ambulatory Visit (HOSPITAL_COMMUNITY): Payer: Self-pay

## 2024-07-28 DIAGNOSIS — C9001 Multiple myeloma in remission: Secondary | ICD-10-CM

## 2024-09-14 ENCOUNTER — Other Ambulatory Visit: Payer: Self-pay

## 2024-09-14 ENCOUNTER — Ambulatory Visit
Admission: RE | Admit: 2024-09-14 | Discharge: 2024-09-14 | Disposition: A | Discharge status: Home / Self Care | Source: Ambulatory Visit

## 2024-09-14 DIAGNOSIS — C9001 Multiple myeloma in remission: Secondary | ICD-10-CM | POA: Insufficient documentation

## 2024-09-17 DIAGNOSIS — M6289 Other specified disorders of muscle: Secondary | ICD-10-CM

## 2024-09-17 DIAGNOSIS — Z9889 Other specified postprocedural states: Secondary | ICD-10-CM

## 2024-09-17 DIAGNOSIS — M898X8 Other specified disorders of bone, other site: Secondary | ICD-10-CM

## 2024-09-17 DIAGNOSIS — M4856XA Collapsed vertebra, not elsewhere classified, lumbar region, initial encounter for fracture: Secondary | ICD-10-CM

## 2024-09-17 DIAGNOSIS — M7989 Other specified soft tissue disorders: Secondary | ICD-10-CM

## 2024-09-24 ENCOUNTER — Ambulatory Visit (HOSPITAL_COMMUNITY): Payer: Self-pay
